# Patient Record
Sex: Male | Born: 2000 | Race: Black or African American | Hispanic: No
Health system: Southern US, Community
[De-identification: ages and names within clinical notes are randomized; demographics above are authoritative.]

---
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
## 2022-01-24 ENCOUNTER — Ambulatory Visit (INDEPENDENT_AMBULATORY_CARE_PROVIDER_SITE_OTHER): Payer: No Payment, Other | Admitting: Psychiatry

## 2022-01-24 ENCOUNTER — Encounter (HOSPITAL_COMMUNITY): Payer: Self-pay | Admitting: Psychiatry

## 2022-01-24 VITALS — BP 118/72 | HR 92 | Temp 98.3°F | Ht 66.0 in | Wt 140.0 lb

## 2022-01-24 DIAGNOSIS — F22 Delusional disorders: Secondary | ICD-10-CM

## 2022-01-24 DIAGNOSIS — F411 Generalized anxiety disorder: Secondary | ICD-10-CM

## 2022-01-24 DIAGNOSIS — F1220 Cannabis dependence, uncomplicated: Secondary | ICD-10-CM

## 2022-01-24 MED ORDER — RISPERIDONE 2 MG PO TABS: 2.0000 mg | ORAL_TABLET | Freq: Two times a day (BID) | ORAL | 2 refills | Status: DC

## 2022-01-24 NOTE — Progress Notes (Signed)
[place] Follow up visit  Patient Identification: Jeremy Cooper MRN:  [id number] Date of Evaluation:  01/24/2022 Referral Source: [facility] Chief Complaint:   No chief complaint on file.  Visit Diagnosis:    ICD-10-CM   1. Delusional disorder ([place])  F22     2. Generalized anxiety disorder  F41.1     3. Moderate tetrahydrocannabinol (THC) dependence (HCC)  F12.20        History of Present Illness: Patient is a 21 years old currently single African-American male initially referred by [facility], also seeing a therapist.  Patient was with his mom.  Has been diagnosed with delusions in the past in the emergency room and was given Zyprexa in December that helped him he has now finished up with that medication.  On eval remains guarded , endorses paranoia around people, says may want to move to [place] where he has more friends near by  Does not endorse hallucinations  Taking risperdal but not used zyprexa which was given for agitation prn Works at [employer] Also endorses using [facility] says use more of that , despite understanding or discussed risk of increase paranoia with it Says it helps him   Mom has endorsed before grandmother sister having schizophrenia and also patient's dad had some undiagnosed mental illness.     Aggravating factors; has not matained jobs for long Modifying factors; mom  Severity: still hallucinating Duration since young age  Hospital admission ER visit Suicide attempt denies  Past Psychiatric History: delusions, hallucinations  Previous Psychotropic Medications: Yes   Substance Abuse History in the last 12 months:  Yes.    Consequences of Substance Abuse: Effect of THC on or causing paranoia, amotivation and impaired judjement discussed  Past Medical History: History reviewed. No pertinent past medical history. History reviewed. No pertinent surgical history.  Family Psychiatric History: Grand Mother sister: schizophrenia Biological dad:? Undiagnosed  mental illness according to patient mom  Family History: History reviewed. No pertinent family history.  Social History:   Social History   Socioeconomic History   Marital status: Single    Spouse name: Not on file   Number of children: Not on file   Years of education: Not on file   Highest education level: Not on file  Occupational History   Not on file  Tobacco Use   Smoking status: Not on file   Smokeless tobacco: Not on file  Substance and Sexual Activity   Alcohol use: Not on file   Drug use: Not on file   Sexual activity: Not on file  Other Topics Concern   Not on file  Social History Narrative   ** Merged History Encounter **       Social Determinants of Health   Financial Resource Strain: Not on file  Food Insecurity: Not on file  Transportation Needs: Not on file  Physical Activity: Not on file  Stress: Not on file  Social Connections: Not on file    Additional Social History: grew up mostly with mom> did well till 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, to himself and not engaged in sports and activities  Patient says has been hearing voices since young age, mom didn't know  Allergies:   No Known Allergies   Metabolic Disorder Labs: Lab Results  Component Value Date   HGBA1C 4.9 07/28/2021   MPG 93.93 07/28/2021   No results found for: "PROLACTIN" No results found for: "CHOL", "TRIG", "HDL", "CHOLHDL", "VLDL", "LDLCALC" Lab Results  Component Value Date  TSH 4.121 07/28/2021    Therapeutic Level Labs: No results found for: "LITHIUM" No results found for: "CBMZ" No results found for: "VALPROATE"  Current Medications: Current Outpatient Medications  Medication Sig Dispense Refill   diphenhydramine-acetaminophen (TYLENOL PM) 25-500 MG TABS tablet Take 2 tablets by mouth at bedtime as needed (For sleep). (Patient not taking: Reported on 10/13/2021)     OLANZapine (ZYPREXA) 2.5 MG tablet Take 1 tablet (2.5 mg total) by mouth 2 (two) times  daily as needed (increase agitation). 30 tablet 0   risperiDONE (RISPERDAL) 2 MG tablet Take 1 tablet (2 mg total) by mouth 2 (two) times daily. 60 tablet 2   No current facility-administered medications for this visit.     Psychiatric Specialty Exam: Review of Systems  Cardiovascular:  Negative for chest pain.  Neurological:  Negative for tremors.  Psychiatric/Behavioral:  Negative for agitation and self-injury.     Blood pressure 118/72, pulse 92, temperature 98.3 F (36.8 C), height 5\' 6"  (1.676 m), weight 140 lb (63.5 kg).Body mass index is 22.6 kg/m.  General Appearance: Casual  Eye Contact: fair  Speech:  Slow  Volume:  Normal  Mood:  not irritable today  Affect:  Congruent  Thought Process:  Linear  Orientation:  Full (Time, Place, and Person)  Thought Content:  Hallucinations: Auditory and Paranoid Ideation  Suicidal Thoughts:  No  Homicidal Thoughts:  No  Memory:  Immediate;   Fair  Judgement:  Poor  Insight:  Shallow  Psychomotor Activity:  Normal  Concentration:  Concentration: Fair  Recall:  [place]: Fair  Akathisia:  No  Handed:    AIMS (if indicated):  no involuntary movements  Assets:  Housing Leisure Time Physical Health Social Support  ADL's:  Intact  Cognition: WNL  Sleep:  Fair   Screenings: PHQ2-9    [place] Office Visit from 07/13/2021 in [place] [facility] from 06/09/2021 in [place]  PHQ-2 Total Score 2 2  PHQ-9 Total Score 7 8      [place] Office Visit from 01/24/2022 in [place] Office Visit from 10/13/2021 in [place] Office Visit from 08/18/2021 in [place] No Risk Error: Q3, 4, or 5 should not be populated when Q2 is No Error: Q3, 4, or 5 should not be populated  when Q2 is No       Assessment and Plan: as follows  Prior documentation reviewed  Schizophrenia or psychosis unspecified: considering he has family history of schizophrenia, paranoia and hallucinations more then 5 -6 months  Does not endors AH but has paranoia, feels meds not need increase, still taking THC despite it can cause paranoia  Use olanzapine prn for agitation   Marijuana dependence moderate; ;still using, discussed its risk  GAD: gets anxious around people but does better when with mom or by himself Direct care time spent including face to face, chart review and documentation  20 min  Fu 56m.  Merian Capron, MD 10/17/20233:02 PM

## 2022-04-25 ENCOUNTER — Ambulatory Visit (HOSPITAL_COMMUNITY): Payer: No Payment, Other | Admitting: Psychiatry

## 2022-05-08 ENCOUNTER — Ambulatory Visit (HOSPITAL_COMMUNITY): Payer: No Payment, Other | Admitting: Psychiatry

## 2022-05-22 ENCOUNTER — Encounter (HOSPITAL_COMMUNITY): Payer: Self-pay | Admitting: Psychiatry

## 2022-05-22 ENCOUNTER — Encounter (HOSPITAL_COMMUNITY): Payer: Self-pay | Admitting: Emergency Medicine

## 2022-05-22 ENCOUNTER — Other Ambulatory Visit: Payer: Self-pay

## 2022-05-22 ENCOUNTER — Ambulatory Visit (HOSPITAL_COMMUNITY)
Admission: EM | Admit: 2022-05-22 | Discharge: 2022-05-22 | Disposition: A | Discharge status: Other Acute Inpatient Hospital | Payer: Self-pay | Attending: Family | Admitting: Family

## 2022-05-22 ENCOUNTER — Inpatient Hospital Stay (HOSPITAL_COMMUNITY)
Admission: AD | Admit: 2022-05-22 | Discharge: 2022-05-27 | DRG: 885 | Disposition: A | Discharge status: Home / Self Care | Payer: No Typology Code available for payment source | Source: Intra-hospital | Attending: Psychiatry | Admitting: Psychiatry

## 2022-05-22 DIAGNOSIS — F1220 Cannabis dependence, uncomplicated: Secondary | ICD-10-CM | POA: Diagnosis present

## 2022-05-22 DIAGNOSIS — Z20822 Contact with and (suspected) exposure to covid-19: Secondary | ICD-10-CM | POA: Diagnosis present

## 2022-05-22 DIAGNOSIS — Z79899 Other long term (current) drug therapy: Secondary | ICD-10-CM

## 2022-05-22 DIAGNOSIS — Z91199 Patient's noncompliance with other medical treatment and regimen due to unspecified reason: Secondary | ICD-10-CM | POA: Diagnosis not present

## 2022-05-22 DIAGNOSIS — F329 Major depressive disorder, single episode, unspecified: Secondary | ICD-10-CM | POA: Diagnosis present

## 2022-05-22 DIAGNOSIS — F203 Undifferentiated schizophrenia: Secondary | ICD-10-CM | POA: Diagnosis present

## 2022-05-22 DIAGNOSIS — F411 Generalized anxiety disorder: Secondary | ICD-10-CM | POA: Diagnosis present

## 2022-05-22 DIAGNOSIS — Z1152 Encounter for screening for COVID-19: Secondary | ICD-10-CM | POA: Insufficient documentation

## 2022-05-22 DIAGNOSIS — K5900 Constipation, unspecified: Secondary | ICD-10-CM | POA: Diagnosis present

## 2022-05-22 DIAGNOSIS — G4700 Insomnia, unspecified: Secondary | ICD-10-CM | POA: Diagnosis present

## 2022-05-22 DIAGNOSIS — Z91148 Patient's other noncompliance with medication regimen for other reason: Secondary | ICD-10-CM

## 2022-05-22 DIAGNOSIS — R45851 Suicidal ideations: Secondary | ICD-10-CM | POA: Insufficient documentation

## 2022-05-22 DIAGNOSIS — R440 Auditory hallucinations: Secondary | ICD-10-CM | POA: Insufficient documentation

## 2022-05-22 DIAGNOSIS — K30 Functional dyspepsia: Secondary | ICD-10-CM | POA: Diagnosis present

## 2022-05-22 LAB — COMPREHENSIVE METABOLIC PANEL
ALT: 13 U/L (ref 0–44)
AST: 25 U/L (ref 15–41)
Albumin: 4.3 g/dL (ref 3.5–5.0)
Alkaline Phosphatase: 64 U/L (ref 38–126)
Anion gap: 11 (ref 5–15)
BUN: 9 mg/dL (ref 6–20)
CO2: 26 mmol/L (ref 22–32)
Calcium: 9.3 mg/dL (ref 8.9–10.3)
Chloride: 102 mmol/L (ref 98–111)
Creatinine, Ser: 0.85 mg/dL (ref 0.61–1.24)
GFR, Estimated: >60 mL/min (ref >60)
Glucose, Bld: 95 mg/dL (ref 70–99)
Potassium: 3.5 mmol/L (ref 3.5–5.1)
Sodium: 139 mmol/L (ref 135–145)
Total Bilirubin: 0.4 mg/dL (ref 0.3–1.2)
Total Protein: 6.9 g/dL (ref 6.5–8.1)

## 2022-05-22 LAB — TSH: TSH: 1.631 u[IU]/mL (ref 0.350–4.500)

## 2022-05-22 LAB — POCT URINE DRUG SCREEN - MANUAL ENTRY (I-SCREEN)
POC Amphetamine UR: NOT DETECTED
POC Buprenorphine (BUP): NOT DETECTED
POC Cocaine UR: NOT DETECTED
POC Marijuana UR: POSITIVE — AB
POC Methadone UR: NOT DETECTED
POC Methamphetamine UR: NOT DETECTED
POC Morphine: NOT DETECTED
POC Oxazepam (BZO): NOT DETECTED
POC Oxycodone UR: NOT DETECTED
POC Secobarbital (BAR): NOT DETECTED

## 2022-05-22 LAB — LIPID PANEL
Cholesterol: 164 mg/dL (ref 0–200)
HDL: 37 mg/dL — ABNORMAL LOW (ref >40)
LDL Cholesterol: 114 mg/dL — ABNORMAL HIGH (ref 0–99)
Total CHOL/HDL Ratio: 4.4 RATIO
Triglycerides: 64 mg/dL (ref <150)
VLDL: 13 mg/dL (ref 0–40)

## 2022-05-22 LAB — CBC WITH DIFFERENTIAL/PLATELET
Abs Immature Granulocytes: 0.01 10*3/uL (ref 0.00–0.07)
Basophils Absolute: 0 10*3/uL (ref 0.0–0.1)
Basophils Relative: 0 %
Eosinophils Absolute: 0.1 10*3/uL (ref 0.0–0.5)
Eosinophils Relative: 2 %
HCT: 40.1 % (ref 39.0–52.0)
Hemoglobin: 14 g/dL (ref 13.0–17.0)
Immature Granulocytes: 0 %
Lymphocytes Relative: 57 %
Lymphs Abs: 3.1 10*3/uL (ref 0.7–4.0)
MCH: 29.7 pg (ref 26.0–34.0)
MCHC: 34.9 g/dL (ref 30.0–36.0)
MCV: 85 fL (ref 80.0–100.0)
Monocytes Absolute: 0.4 10*3/uL (ref 0.1–1.0)
Monocytes Relative: 7 %
Neutro Abs: 1.8 10*3/uL (ref 1.7–7.7)
Neutrophils Relative %: 34 %
Platelets: 268 10*3/uL (ref 150–400)
RBC: 4.72 MIL/uL (ref 4.22–5.81)
RDW: 12.6 % (ref 11.5–15.5)
WBC: 5.5 10*3/uL (ref 4.0–10.5)
nRBC: 0 % (ref 0.0–0.2)

## 2022-05-22 LAB — RESP PANEL BY RT-PCR (RSV, FLU A&B, COVID)  RVPGX2
Influenza A by PCR: NEGATIVE
Influenza B by PCR: NEGATIVE
Resp Syncytial Virus by PCR: NEGATIVE
SARS Coronavirus 2 by RT PCR: NEGATIVE

## 2022-05-22 LAB — POC SARS CORONAVIRUS 2 AG: SARSCOV2ONAVIRUS 2 AG: NEGATIVE

## 2022-05-22 LAB — HEMOGLOBIN A1C
Hgb A1c MFr Bld: 4.9 % (ref 4.8–5.6)
Mean Plasma Glucose: 93.93 mg/dL

## 2022-05-22 MED ORDER — OLANZAPINE 10 MG PO TBDP: 10.0000 mg | ORAL_TABLET | Freq: Three times a day (TID) | ORAL | Status: DC | PRN

## 2022-05-22 MED ORDER — RISPERIDONE 2 MG PO TABS: 2.0000 mg | ORAL_TABLET | Freq: Every day | ORAL | Status: DC

## 2022-05-22 MED ORDER — HYDROXYZINE HCL 25 MG PO TABS: 25.0000 mg | ORAL_TABLET | Freq: Three times a day (TID) | ORAL | Status: DC | PRN

## 2022-05-22 MED ORDER — ACETAMINOPHEN 325 MG PO TABS: 650.0000 mg | ORAL_TABLET | Freq: Four times a day (QID) | ORAL | Status: DC | PRN

## 2022-05-22 MED ORDER — MAGNESIUM HYDROXIDE 400 MG/5ML PO SUSP: 30.0000 mL | Freq: Every day | ORAL | Status: DC | PRN

## 2022-05-22 MED ORDER — ZIPRASIDONE MESYLATE 20 MG IM SOLR: 20.0000 mg | INTRAMUSCULAR | Status: DC | PRN

## 2022-05-22 MED ORDER — ALUM & MAG HYDROXIDE-SIMETH 200-200-20 MG/5ML PO SUSP: 30.0000 mL | ORAL | Status: DC | PRN

## 2022-05-22 MED ORDER — LORAZEPAM 1 MG PO TABS: 1.0000 mg | ORAL_TABLET | ORAL | Status: DC | PRN

## 2022-05-22 MED ORDER — TRAZODONE HCL 50 MG PO TABS: 50.0000 mg | ORAL_TABLET | Freq: Every evening | ORAL | Status: DC | PRN

## 2022-05-22 MED ORDER — RISPERIDONE 2 MG PO TABS
2.0000 mg | ORAL_TABLET | Freq: Two times a day (BID) | ORAL | Status: DC
Start: 2022-05-22 — End: 2022-05-22

## 2022-05-22 NOTE — ED Notes (Signed)
Report called to Thayer Headings, RN,  Pending [employer] and Serve wiith IVC papers.

## 2022-05-22 NOTE — Progress Notes (Signed)
   05/22/22 2300  Psych Admission Type (Psych Patients Only)  Admission Status Involuntary  Psychosocial Assessment  Patient Complaints Suspiciousness;Anxiety;Other (Comment);Crying spells (hearing voices)  Eye Contact Brief  Facial Expression Flat  Affect Flat  Speech Soft  Interaction Minimal  Motor Activity Fidgety  Appearance/Hygiene Unremarkable  Behavior Characteristics Cooperative;Fidgety;Guarded  Mood Depressed;Anxious  Aggressive Behavior  Effect No apparent injury  Thought Process  Coherency WDL  Content Blaming others  Delusions None reported or observed  Perception Hallucinations  Hallucination Auditory  Judgment Limited  Confusion None  Danger to Self  Current suicidal ideation? Denies  Danger to Others  Danger to Others None reported or observed

## 2022-05-22 NOTE — BH Assessment (Signed)
Comprehensive Clinical Assessment (CCA) Note  05/22/2022 Jeremy Cooper [id number]  DISPOSITION: Pending AP assessment  The patient demonstrates the following risk factors for suicide: Chronic risk factors for suicide include: psychiatric disorder of MDD and substance use disorder. Acute risk factors for suicide include: social withdrawal/isolation and cannabis use . Protective factors for this patient include: hope for the future. Considering these factors, the overall suicide risk at this point appears to be moderate. Patient is appropriate for outpatient follow up.   Jeremy Cooper is a 22 yo male who presented voluntarily and accompanied by his mother. Mother was nor presente for the assessment. Pt stated he has hearing voices and feeling hopeless at times that that is what mode his mother tell him to come in for an assessment. Pt denied current SI but stated that he had some SI "last week." Pt denied any plan of action. Pt stated that he did try once to kill himself by cutting his wrist. Pt stated that attempt was several years ago. P dened he has ever been hospitalized psychiatrically. Pt reported that he is and has been having thought of hurting other people in general. Pt stated that he has a plan but would not disclose any details about that plan. Pt denied targeting anyone specific to be hurt or killed. Pt denied NSSH. Pt stated that he has been hearing voices for a few years. Pt stated that at times the voices tell him to do things but would not be specific as to what they were.are telling him to do. Pt stated that he feels people watching him all the time everywhere he goes. Pt reported daily use of cannabis since the age of 46. Pt senied any other substance use. Pt denied any past psychiatric hospitalizations.   Pt reports he currently lives with his mother and that his mother raised him. Pt stated he graduated high school and now works at [employer]. Pt reported smoking cannabis daily and  denied all other substance use.   Pt was calm, cooperative, alert and cooperative. Pt seemed irritable at times.Pt's mood seemed depressed and his flat affect was congruent. Pt's speech and movement were wihtin normal limits. Pt's judgment and insight seemed impaired.   Chief Complaint:  Chief Complaint  Patient presents with   Depression   Visit Diagnosis:  MDD, Recurrent, Severe Cannabis Use d/o    CCA Screening, Triage and Referral (STR)  Patient Reported Information How did you hear about [place]? Family/Friend (mother)  What Is the Reason for Your Visit/Call Today? Jeremy Cooper is a 22 yo male who presented voluntarily and accompanied by his mother. Mother was nor presente for the assessment. Pt stated he has hearing voices and feeling hopeless at times that that is what mode his mother tell him to come in for an assessment. Pt denied current SI but stated that he had some SI "last week." Pt denied any plan of action. Pt stated that he did try once to kill himself by cutting his wrist. Pt stated that attempt was several years ago. P dened he has ever been hospitalized psychiatrically. Pt reported that he is and has been having thought of hurting others people in general. Pt stated that he has a plan but would not disclose any details about that plan. Pt denied targeting anyone specific to be hurt or killed. Pt denied NSSH. Pt stated that he has been hearing voices for a few years. Pt stated that at times the voices tell him to do things but would  not be specific as to what they were.are telling him to do. Pt stated that he feels people watching him all the time everywhere he goes. Pt reported daily use of cannabis since the age of 22. Pt senied any other substance use. Pt denied any past psychiatric hospitalizations.  How Long Has This Been Causing You Problems? > than 6 months  What Do You Feel Would Help You the Most Today? Treatment for Depression or other mood problem; Alcohol or Drug  Use Treatment   Have You Recently Had Any Thoughts About Hurting Yourself? Yes  Are You Planning to Commit Suicide/Harm Yourself At This time? No   Flowsheet Row ED from 05/22/2022 in [facility] Office Visit from 01/24/2022 in [place] at [place] Visit from 10/13/2021 in [place] at [place] Moderate Risk No Risk Error: Q3, 4, or 5 should not be populated when Q2 is No       Have you Recently Had Thoughts About Jeremy Cooper? Yes  Are You Planning to Harm Someone at This Time? Yes  Explanation: pt would give no details   Have You Used Any Alcohol or Drugs in the Past 24 Hours? Yes  What Did You Use and How Much? unknown amount   Do You Currently Have a Therapist/Psychiatrist? Yes  Name of Therapist/Psychiatrist: Name of Therapist/Psychiatrist: Dr. De Nurse   Have You Been Recently Discharged From Any Office Practice or Programs? No  Explanation of Discharge From Practice/Program: na     CCA Screening Triage Referral Assessment Type of Contact: Face-to-Face  Telemedicine Service Delivery:   Is this Initial or Reassessment?   Date Telepsych consult ordered in [facility]:    Time Telepsych consult ordered in [facility]:    Location of Assessment: [facility] Assessment Services  Provider Location: [facility] Assessment Services   Collateral Involvement: pt mother accompanied pt but pt assessed alone   Does Patient Have a Stage manager Guardian? No  Legal Guardian Contact Information: na  Copy of Legal Guardianship Form: No - copy requested  Legal Guardian Notified of Arrival: -- (na)  Legal Guardian Notified of Pending Discharge: -- (na)  If Minor and Not Living with Parent(s), Who has Custody? na  Is CPS involved or ever been involved? Never (none reported)  Is APS involved or ever been involved? Never (none  reported)   Patient Determined To Be At Risk for Harm To Self or Others Based on Review of Patient Reported Information or Presenting Complaint? Yes, for Harm to Others  Method: Plan with intent and identified person  Availability of Means: Has close by (Pt did not give details of his plan to hurt others.)  Intent: Intends to cause physical harm but not necessarily death  Notification Required: No need or identified person (Pt stated no one in particular was targeted.)  Additional Information for Danger to Others Potential: Active psychosis (AV and some paranoia)  Additional Comments for Danger to Others Potential: na  Are There Guns or Other Weapons in Your Home? No (denied)  Types of Guns/Weapons: na  Are These Weapons Safely Secured?                            -- (na)  Who Could Verify You Are Able To Have These Secured: na  Do You Have any Outstanding Charges, Pending Court Dates, Parole/Probation? denied- none reported  Contacted To Inform of Risk of Harm To Self or Others: -- (No specific person or people were idenitified. Pt refused.)    Does Patient Present under Involuntary Commitment? No    [place] of Residence: [place]   Patient Currently Receiving the Following Services: Medication Management   Determination of Need: Urgent (48 hours)   Options For Referral: [facility]     CCA Biopsychosocial Patient Reported Schizophrenia/Schizoaffective Diagnosis in Past: No   Strengths: likes to exercise, likes being in nature,   Mental Health Symptoms Depression:   Change in energy/activity; Difficulty Concentrating; Fatigue; Hopelessness; Increase/decrease in appetite; Irritability; Sleep (too much or little); Tearfulness; Worthlessness   Duration of Depressive symptoms:    Mania:   None   Anxiety:    Difficulty concentrating; Fatigue; Irritability; Restlessness; Sleep; Tension; Worrying   Psychosis:   Hallucinations   Duration of Psychotic  symptoms:  Duration of Psychotic Symptoms: Greater than six months   Trauma:   None   Obsessions:   None   Compulsions:   None   Inattention:   N/A   Hyperactivity/Impulsivity:   N/A   Oppositional/Defiant Behaviors:   N/A   Emotional Irregularity:   None   Other Mood/Personality Symptoms:   None    Mental Status Exam Appearance and self-care  Stature:   Average   Weight:   Average weight   Clothing:   Casual   Grooming:   Normal   Cosmetic use:   None   Posture/gait:   Normal   Motor activity:   Not Remarkable   Sensorium  Attention:   Normal   Concentration:   Normal   Orientation:   X5   Recall/memory:   Normal   Affect and Mood  Affect:   Anxious; Depressed; Flat   Mood:   Anxious; Depressed   Relating  Eye contact:   Fleeting   Facial expression:   Depressed   Attitude toward examiner:   Cooperative; Guarded; Irritable   Thought and Language  Speech flow:  Clear and Coherent   Thought content:   Appropriate to Mood and Circumstances   Preoccupation:   None   Hallucinations:   Auditory; Command (Comment)   Organization:   Insurance account manager of Knowledge:   Average   Intelligence:   Average   Abstraction:   Normal   Judgement:   Impaired   Reality Testing:   Distorted   Insight:   Gaps; Lacking   Decision Making:   Vacilates   Social Functioning  Social Maturity:   -- (Unable to assess due to psychitic and mood-related sx.)   Social Judgement:   Normal   Stress  Stressors:   Museum/gallery curator; Work; Other (Comment) (Feels like he let his family down)   Coping Ability:   Overwhelmed; Exhausted   Skill Deficits:   None   Supports:   Family; Friends/Service system     Religion: Religion/Spirituality Are You A Religious Person?: Yes How Might This Affect Treatment?: Support in treatement  Leisure/Recreation: Leisure / Recreation Do You Have Hobbies?:  Yes Leisure and Hobbies: exercise  Exercise/Diet: Exercise/Diet Do You Exercise?: Yes What Type of Exercise Do You Do?: Run/Walk How Many Times a Week Do You Exercise?: 1-3 times a week Have You Gained or Lost A Significant Amount of Weight in the Past Six Months?: No Do You Follow a Special Diet?: No Do You Have Any Trouble Sleeping?: No   CCA Employment/Education Employment/Work Situation: Employment / Work  Situation Employment Situation: Employed (at [employer]) Work Stressors: none reported Patient's Job has Been Impacted by Current Illness: No Has Patient ever Been in the [employer]?: No  Education: Education Is Patient Currently Attending School?: No Last Grade Completed: 15 Did [place]?: No Did You Have An Individualized Education Program (IIEP): No Did You Have Any Difficulty At School?: No Patient's Education Has Been Impacted by Current Illness: No   CCA Family/Childhood History Family and Relationship History: Family history Marital status: Single Does patient have children?: No  Childhood History:  Childhood History By whom was/is the patient raised?: Mother Did patient suffer any verbal/emotional/physical/sexual abuse as a child?: No Has patient ever been sexually abused/assaulted/raped as an adolescent or adult?: No Witnessed domestic violence?: Yes Has patient been affected by domestic violence as an adult?: No       CCA Substance Use Alcohol/Drug Use: Alcohol / Drug Use Pain Medications: Pt reports he has history of using oxycodone Prescriptions: Denies abuse Over the Counter: Denies abuse History of alcohol / drug use?: Yes Longest period of sobriety (when/how long): unknown Negative Consequences of Use:  (Pt denies) Withdrawal Symptoms: None (Pt denies) Substance #1 Name of Substance 1: cannabis 1 - Age of First Use: 14 1 - Amount (size/oz): varies 1 - Frequency: daily use 1 - Duration: ongoing 1 - Last Use / Amount: today 1 -  Method of Aquiring: unknown 1- Route of Use: smoke                       ASAM's:  Six Dimensions of Multidimensional Assessment  Dimension 1:  Acute Intoxication and/or Withdrawal Potential:   Dimension 1:  Description of individual's past and current experiences of substance use and withdrawal: Used to smoke daily but has reduced to weekly  Dimension 2:  Biomedical Conditions and Complications:   Dimension 2:  Description of patient's biomedical conditions and  complications: None  Dimension 3:  Emotional, Behavioral, or Cognitive Conditions and Complications:  Dimension 3:  Description of emotional, behavioral, or cognitive conditions and complications: Patient is experiencing auditory hallucinations and anxiety symptoms.  Dimension 4:  Readiness to Change:  Dimension 4:  Description of Readiness to Change criteria: Has reduced and thinking about stopping  Dimension 5:  Relapse, Continued use, or Continued Problem Potential:  Dimension 5:  Relapse, continued use, or continued problem potential critiera description: Has reduced and is continuing to slow down  Dimension 6:  Recovery/Living Environment:  Dimension 6:  Recovery/Iiving environment criteria description: Feels like mother judges him at home  ASAM Severity Score: ASAM's Severity Rating Score: 8  ASAM Recommended Level of Treatment: ASAM Recommended Level of Treatment: Level I Outpatient Treatment   Substance use Disorder (SUD) Substance Use Disorder (SUD)  Checklist Symptoms of Substance Use: Continued use despite having a persistent/recurrent physical/psychological problem caused/exacerbated by use  Recommendations for Services/Supports/Treatments: Recommendations for Services/Supports/Treatments Recommendations For Services/Supports/Treatments: Individual Therapy, Medication Management, Other (Comment) (OBS)  Discharge Disposition:    DSM5 Diagnoses: There are no problems to display for this  patient.    Referrals to Alternative Service(s): Referred to Alternative Service(s):   Place:   Date:   Time:    Referred to Alternative Service(s):   Place:   Date:   Time:    Referred to Alternative Service(s):   Place:   Date:   Time:    Referred to Alternative Service(s):   Place:   Date:   Time:     Drelyn Pistilli T,  Counselor  Stanton Kidney T. Mare Ferrari, [place], [facility], [facility] Triage Specialist [facility]

## 2022-05-22 NOTE — ED Notes (Signed)
Patient arrived to unit and was oriented to unit. Denies SI/HI but endorses some auditory hallucinations. He does not elaborate on what the voices are saying but he is here voluntarily for help. Contracts for safety while on unit.

## 2022-05-22 NOTE — ED Notes (Signed)
[facility] Transport to [facility] to [facility], Pt is IVC.  RM [id number].  Dispatch Contacted.

## 2022-05-22 NOTE — Progress Notes (Signed)
   05/22/22 1220  Jeremy Cooper (Walk-ins at [facility] only)  How Did You Hear About [place]? Family/Friend (mother)  What Is the Reason for Your Visit/Call Today? Jeremy Cooper is a 22 yo male who presented voluntarily and accompanied by his mother. Mother was nor presente for the assessment. Pt stated he has hearing voices and feeling hopeless at times that that is what mode his mother tell him to come in for an assessment. Pt denied current SI but stated that he had some SI "last week." Pt denied any plan of action. Pt stated that he did try once to kill himself by cutting his wrist. Pt stated that attempt was several years ago. P dened he has ever been hospitalized psychiatrically. Pt reported that he is and has been having thought of hurting others people in general. Pt stated that he has a plan but would not disclose any details about that plan. Pt denied targeting anyone specific to be hurt or killed. Pt denied NSSH. Pt stated that he has been hearing voices for a few years. Pt stated that at times the voices tell him to do things but would not be specific as to what they were.are telling him to do. Pt stated that he feels people watching him all the time everywhere he goes. Pt reported daily use of cannabis since the age of 43. Pt senied any other substance use. Pt denied any past psychiatric hospitalizations.  How Long Has This Been Causing You Problems? > than 6 months  Have You Recently Had Any Thoughts About Hurting Yourself? Yes  How long ago did you have thoughts about hurting yourself? "last week"  Are You Planning to [place] At This time? No  Have you Recently Had Thoughts About [place]? Yes  How long ago did you have thoughts of harming others? current thoughts  Are You Planning To Harm Someone At This Time? Yes  Explanation: pt would give no details  Are you currently experiencing any auditory, visual or other hallucinations? Yes  Please explain the  hallucinations you are currently experiencing: hearing voices that at times give commands to hurt himself or others  Have You Used Any Alcohol or Drugs in the Past 24 Hours? Yes  How long ago did you use Drugs or Alcohol? cannabis today  What Did You Use and How Much? unknown amount  Do you have any current medical co-morbidities that require immediate attention? No  Clinician description of patient physical appearance/behavior: Pt was calm, cooperative, alert and cooperative. Pt seemed irritable at times.Pt's mood seemed depressed and his flat affect was congruent. Pt's speech and movement were wihtin normal limits. Pt's judgment and insight seemed impaired.  What Do You Feel Would Help You the Most Today? Treatment for Depression or other mood problem;Alcohol or Drug Use Treatment  If access to [facility] Urgent Care was not available, would you have sought care in the Emergency Department? No  Determination of Need Urgent (48 hours)  Options For Referral Facility-Based Crisis   Jeremy Cooper, [place], [facility], [facility] Triage Specialist [facility]

## 2022-05-22 NOTE — Progress Notes (Signed)
ADMISSION NOTE:    This patient is a 22-year male that presented to [facility] with his mother reporting of AH/SI/HI. At this time, the patient presents to [facility] reporting AH/HI. Patient reports that he has been hearing voices for "almost a year now." Reports that the voices, "tell him to harm himself sometimes or sometimes other people." He also states, "sometimes I want to kill myself and sometimes I don't." He denies SI/VH at this time. He reports that he stopped taking his medications x1 month. At this time patient is tearful stating that, "I just want to go home." Patient contracts for safety at this time. Patient oriented to unit. Patient educated on [facility] rules and policies, and patient verbalized understanding. Patient made aware about q15 min checks. Patient denies having any questions at this time.

## 2022-05-22 NOTE — Progress Notes (Addendum)
Pt was accepted to [facility] 05/22/22 PENDING IVC paperwork faxed to [phone]  Pt meets inpatient criteria per Ricky Ala, NP  Attending Physician will be Dr. Caswell Corwin  Report can be called to: Adult unit: [phone]  Pt can arrive after PENDING ITEMS; [place] will coordinate and provide bed assignment number.  Care Team notified: [facility] Dana, RN, Evette Georges, NP, Luanna Salk, RN,  Miguel Rota, RN, Doreatha Lew, RN  Nadara Mode, LCSWA 05/22/2022 @ 7:03 PM

## 2022-05-22 NOTE — Progress Notes (Signed)
   Initial Treatment Plan 05/22/2022 11:19 PM Jeremy Cooper  MRN: [id number]       PATIENT STRESSORS:  Family conflict  Lack of sleep Noncompliance with medication       PATIENT STRENGTHS: Communication skills      PATIENT IDENTIFIED PROBLEMS: "I am hearing voices that tell me to harm myself and other people"  "I havent taken my medications in about a month"  Depression  Anxiety   "sometimes I want to kill my and sometimes I dont."                   DISCHARGE CRITERIA:  Ability to meet basic life and health needs Improved stabilization in mood, thinking, and/or behavior Verbal commitment to aftercare and medication compliance   PRELIMINARY DISCHARGE PLAN: Outpatient therapy Return to previous living arrangement   PATIENT/FAMILY INVOLVEMENT: This treatment plan has been presented to and reviewed with the patient, Jeremy Cooper , The patient has been given the opportunity to ask questions and make suggestions.   Jeremy Cooper Robert, RN 05/22/2022 11:25 PM

## 2022-05-22 NOTE — ED Provider Notes (Cosign Needed Addendum)
**Note Jeremy-Identified via Obfuscation** Facility Based Crisis Admission H&P  Date: 05/22/22 Patient Name: Jeremy Cooper MRN: [id number] Chief Complaint: Suicidal and homicidal ideation  Diagnoses:  Final diagnoses:  Suicidal ideation  Auditory hallucination   HPI: Jeremy Cooper 22 year old male who presents to [facility] urgent care accompanied by his mother.  She reports an episode on 05/21/2022  (last night) where she reported that the patient was actively responding to internal stimuli. Patient reported command auditory hallucination. Stated he hasn't been sleeping due to working overnight. Stated that patient has not been taking medications as directed.  During this assessment Jeremy Cooper reported  having suicidal and homicidal ideations.  Would not disclose plan or intent patient presents very irritable and guarded throughout this assessment.  Jeremy Cooper has a charted history with delusional thought disorder, homicidal ideations, generalized anxiety disorder, psychosis unspecified.  He reports he is prescribed Risperdal which she has not been taking as indicated states " the medications not helping."  Patient was seen and evaluated at the local emergent care 6 months prior with similar concerns.  Medications was adjusted at that time to Zyprexa.  Reports he is currently followed by psychiatrist Jeremy Cooper.  For medication management.  And was seeing Jeremy Cooper  for therapy services.   During evaluation Jeremy Cooper is sitting; he is alert/oriented x 3; calm/cooperative; and mood congruent with affect.  Presents irritable tearful and pressured. Stated " just want to go home."  patient is speaking in a clear tone at moderate volume, and normal pace; with minimal  eye contact. Patient has a hooddie covering  his head.  His thought process is coherent and relevant. Reported active suicidal and homicidal ideation. Will initiate Involuntary commitment.    PHQ 2-9:  [employer] Visit from 07/13/2021 in [place] at [place] from 06/09/2021 in [place] [facility] [place] that you would be better off dead, or of hurting yourself in some way Not at all Not at all  PHQ-9 Total Score 7 [place] [facility] from 05/22/2022 in [facility] Visit from 01/24/2022 in [place] [facility] [place] Visit from 10/13/2021 in [place] [facility] [place] Moderate Risk No Risk Error: Q3, 4, or 5 should not be populated when Q2 is No        Total Time spent with patient: 15 minutes  Musculoskeletal  Strength & Muscle Tone: within normal limits Gait & Station: normal Patient leans: N/A  Psychiatric Specialty Exam  Presentation General Appearance:  Appropriate for Environment  Eye Contact: Minimal  Speech: Clear and Coherent  Speech Volume: Normal  Handedness: Right   Mood and Affect  Mood: Dysphoric  Affect: Labile   Thought Process  Thought Processes: Coherent  Descriptions of Associations:Intact  Orientation:Full (Time, Place and Person)  Thought Content:Logical  Diagnosis of Schizophrenia or Schizoaffective disorder in past: No  Duration of Psychotic Symptoms: N/A  Hallucinations:Hallucinations: Auditory  Ideas of Reference:Paranoia; Delusions  Suicidal Thoughts:Suicidal Thoughts: Yes, Active  Homicidal Thoughts:Homicidal Thoughts: Yes, Active   Sensorium  Memory: Immediate Fair; Recent Fair; Remote Fair  Judgment: Fair  Insight: Fair   Community education officer  Concentration: Fair  Attention Span: Good  Recall: [place] of Knowledge: Good  Language: Good   Psychomotor Activity  Psychomotor Activity:Psychomotor Activity: Normal   Assets  Assets: Desire for Improvement; Intimacy   Sleep  Sleep:Sleep:  Fair   Nutritional Assessment (For OBS and FBC admissions only) Has the patient had a weight loss or gain of 10 pounds or more in the last 3 months?: No Has the patient had a decrease in food intake/or appetite?: No Does the patient have dental problems?: No Does the patient have eating habits or behaviors that may be indicators of an eating disorder including binging or inducing vomiting?: No Has the patient recently lost weight without trying?: 0 Has the patient been eating poorly because of a decreased appetite?: 0 Malnutrition Screening Tool Score: 0    Physical Exam Vitals and nursing note reviewed.  Cardiovascular:     Rate and Rhythm: Normal rate and regular rhythm.  Pulmonary:     Effort: Pulmonary effort is normal.  Neurological:     Mental Status: He is alert and oriented to person, place, and time.  Psychiatric:        Mood and Affect: Mood normal.        Behavior: Behavior normal.    Review of Systems  Cardiovascular: Negative.   Psychiatric/Behavioral:  Positive for hallucinations, substance abuse and suicidal ideas. The patient is nervous/anxious.   All other systems reviewed and are negative.   Blood pressure 109/67, pulse 69, temperature 98 F (36.7 C), temperature source Oral, resp. rate 19, SpO2 100 %. There is no height or weight on file to calculate BMI.  Past Psychiatric History: Charted history with homicidal ideations, generalized anxiety disorder, psychosis and delusional thought disorder.  Has been treated with respite all and Zyprexa in the past  Is the patient at risk to self? Yes  Has the patient been a risk to self in the past 6 months? Yes .    Has the patient been a risk to self within the distant past? No   Is the patient a risk to others? No   Has the patient been a risk to others in the past 6 months? No   Has the patient been a risk to others within the distant past? 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)  Last Labs:  Admission on 05/22/2022  Component Date Value Ref Range Status   POC Amphetamine UR 05/22/2022 None Detected  NONE DETECTED (Cut Off Level 1000 ng/mL) Final   POC Secobarbital (BAR) 05/22/2022 None Detected  NONE DETECTED (Cut Off Level 300 ng/mL) Final   POC Buprenorphine (BUP) 05/22/2022 None Detected  NONE DETECTED (Cut Off Level 10 ng/mL) Final   POC Oxazepam (BZO) 05/22/2022 None Detected  NONE DETECTED (Cut Off Level 300 ng/mL) Final   POC Cocaine UR 05/22/2022 None Detected  NONE DETECTED (Cut Off Level 300 ng/mL) Final   POC Methamphetamine UR 05/22/2022 None Detected  NONE DETECTED (Cut Off Level 1000 ng/mL) Final   POC Morphine 05/22/2022 None Detected  NONE DETECTED (Cut Off Level 300 ng/mL) Final   POC Methadone UR 05/22/2022 None Detected  NONE DETECTED (Cut Off Level 300 ng/mL) Final   POC Oxycodone UR 05/22/2022 None Detected  NONE DETECTED (Cut Off Level 100 ng/mL) Final   POC Marijuana UR 05/22/2022 Positive (A)  NONE DETECTED (Cut Off Level 50 ng/mL) Final   SARSCOV2ONAVIRUS 2 AG 05/22/2022 NEGATIVE  NEGATIVE Final   Comment: (NOTE) SARS-CoV-2 antigen NOT DETECTED.  Negative results are presumptive.  Negative results do not preclude SARS-CoV-2 infection and should not be used as the sole basis for treatment or other patient management decisions, including infection  control decisions, particularly in the presence of clinical signs and  symptoms consistent with COVID-19, or in those who have been in contact with the virus.  Negative results must be combined with clinical observations, patient history, and epidemiological information. The expected result is Negative.  Fact Sheet for Patients:  HandmadeRecipes.com.cy  Fact Sheet for Healthcare Providers: FuneralLife.at  This test is not yet approved or cleared by the [place] FDA and  has been authorized for detection and/or diagnosis of SARS-CoV-2 by FDA under an Emergency Use Authorization (EUA).  This EUA will remain in effect (meaning this test can be used) for the duration of  the COV                          ID-19 declaration under Section 564(b)(1) of the Act, 21 U.S.C. section 360bbb-3(b)(1), unless the authorization is terminated or revoked sooner.      Allergies: Patient has no known allergies.  Medications:  Facility Ordered Medications  Medication   acetaminophen (TYLENOL) tablet 650 mg   alum & mag hydroxide-simeth (MAALOX/MYLANTA) 200-200-20 MG/5ML suspension 30 mL   magnesium hydroxide (MILK OF MAGNESIA) suspension 30 mL   hydrOXYzine (ATARAX) tablet 25 mg   traZODone (DESYREL) tablet 50 mg   PTA Medications  Medication Sig   risperiDONE (RISPERDAL) 2 MG tablet Take 1 tablet (2 mg total) by mouth 2 (two) times daily.     Medical Decision Making  Inpatient admission - Initiated IVC- pending  -     Recommendations  Based on my evaluation the patient does not appear to have an emergency medical condition.  Derrill Center, NP 05/22/22  2:57 PM

## 2022-05-23 DIAGNOSIS — F203 Undifferentiated schizophrenia: Principal | ICD-10-CM | POA: Diagnosis present

## 2022-05-23 DIAGNOSIS — G4700 Insomnia, unspecified: Secondary | ICD-10-CM | POA: Diagnosis present

## 2022-05-23 DIAGNOSIS — F411 Generalized anxiety disorder: Secondary | ICD-10-CM | POA: Diagnosis present

## 2022-05-23 DIAGNOSIS — F329 Major depressive disorder, single episode, unspecified: Secondary | ICD-10-CM | POA: Diagnosis present

## 2022-05-23 DIAGNOSIS — F1220 Cannabis dependence, uncomplicated: Secondary | ICD-10-CM | POA: Diagnosis present

## 2022-05-23 MED ORDER — MAGNESIUM HYDROXIDE 400 MG/5ML PO SUSP: 30.0000 mL | Freq: Every day | ORAL | Status: DC | PRN

## 2022-05-23 MED ORDER — HYDROXYZINE HCL 25 MG PO TABS
25.0000 mg | ORAL_TABLET | Freq: Four times a day (QID) | ORAL | Status: DC | PRN
  Filled 2022-05-23: qty 1

## 2022-05-23 MED ORDER — TRAZODONE HCL 50 MG PO TABS
50.0000 mg | ORAL_TABLET | Freq: Every evening | ORAL | Status: DC | PRN
  Filled 2022-05-23: qty 1

## 2022-05-23 MED ORDER — ACETAMINOPHEN 325 MG PO TABS
650.0000 mg | ORAL_TABLET | Freq: Four times a day (QID) | ORAL | Status: DC | PRN
  Filled 2022-05-23: qty 2

## 2022-05-23 MED ORDER — RISPERIDONE 2 MG PO TABS
2.0000 mg | ORAL_TABLET | Freq: Two times a day (BID) | ORAL | Status: DC
  Administered 2022-05-23 – 2022-05-24 (×2): 2 mg via ORAL
  Filled 2022-05-23 (×8): qty 1

## 2022-05-23 MED ORDER — ALUM & MAG HYDROXIDE-SIMETH 200-200-20 MG/5ML PO SUSP: 30.0000 mL | ORAL | Status: DC | PRN

## 2022-05-23 NOTE — [facility] Group Notes (Signed)
Pt did not attend wrap up group this evening. Pt was in their room resting.

## 2022-05-23 NOTE — [facility] Group Notes (Signed)
Adult Psychoeducational Group Note  Date:  05/23/2022 Time:  3:50 PM  Group Topic/Focus:  Goals Group:   The focus of this group is to help patients establish daily goals to achieve during treatment and discuss how the patient can incorporate goal setting into their daily lives to aide in recovery.  Participation Level:  Minimal  Participation Quality:  Appropriate  Affect:  Appropriate  Cognitive:  Appropriate  Insight: Appropriate  Engagement in Group:  Engaged  Modes of Intervention:  Discussion  Additional Comments:  Group discussed importance of values and boundaries. Patient had limited engagement throughout the entirety of Group.  Victorino December 05/23/2022, 3:50 PM

## 2022-05-23 NOTE — Group Note (Signed)
Recreation Therapy Group Note   Group Topic:Animal Assisted Therapy   Group Date: 05/23/2022 Start Time: 1430 End Time: 1500 Facilitators: Kimm Sider-McCall, LRT,CTRS Location: [address] Dayroom   Animal-Assisted Activity (AAA) Program Checklist/Progress Notes Patient Eligibility Criteria Checklist & Daily Group note for Rec Tx Intervention  AAA/T Program Assumption of Risk Form signed by Patient/ or Parent Legal Guardian Yes  Patient understands his/her participation is voluntary No   Affect/Mood: N/A   Participation Level: Did not attend    Clinical Observations/Individualized Feedback:     Plan: Continue to engage patient in RT group sessions 2-3x/week.   Jeremy Cooper, LRT,CTRS 05/23/2022 3:41 PM

## 2022-05-23 NOTE — [facility] Suicide Risk Assessment (Signed)
Suicide Risk Assessment  Discharge Assessment    [facility]   Principal Problem: Undifferentiated schizophrenia [facility]) Discharge Diagnoses: Principal Problem:   Undifferentiated schizophrenia ([place]) Active Problems:   Insomnia   Delta-9-tetrahydrocannabinol (THC) dependence ([place])   Anxiety state  CC:"I have been hearing voices for a couple of years".   Current Symptoms requiring continuous hospitalization: Insomnia, depressed mood, worsening psychosis-auditory hallucinations-in need of continuous hospitalization for treatment and stabilization. Total Time spent with patient: 1.5 hours  Musculoskeletal: Strength & Muscle Tone: within normal limits Gait & Station: normal Patient leans: N/A  Psychiatric Specialty Exam  Presentation  General Appearance:  Appropriate for Environment; Fairly Groomed  Eye Contact: Poor  Speech: Clear and Coherent  Speech Volume: Decreased  Handedness: Right   Mood and Affect  Mood: Depressed; Dysphoric  Duration of Depression Symptoms: Greater than two weeks  Affect: Congruent   Thought Process  Thought Processes: Coherent  Descriptions of Associations:Intact  Orientation:Full (Time, Place and Person)  Thought Content:Logical  History of Schizophrenia/Schizoaffective disorder:No  Duration of Psychotic Symptoms:Greater than six months  Hallucinations:Hallucinations: Auditory  Ideas of Reference:None  Suicidal Thoughts:Suicidal Thoughts: Yes, Active  Homicidal Thoughts:Homicidal Thoughts: No   Sensorium  Memory: Immediate Good  Judgment: Poor  Insight: Poor   Executive Functions  Concentration: Poor  Attention Span: Poor  Recall: [place] of Knowledge: Fair  Language: Fair   Psychomotor Activity  Psychomotor Activity: Psychomotor Activity: Normal   Assets  Assets: Social Support; Housing   Sleep  Sleep: Sleep: Poor   Physical Exam: Physical  Exam HENT:     Head: Normocephalic.  Eyes:     Pupils: Pupils are equal, round, and reactive to light.  Musculoskeletal:        General: Normal range of motion.    Review of Systems  Constitutional: Negative.   HENT: Negative.    Eyes: Negative.   Respiratory: Negative.    Cardiovascular: Negative.   Gastrointestinal: Negative.   Genitourinary: Negative.   Musculoskeletal: Negative.   Skin: Negative.   Neurological: Negative.   Psychiatric/Behavioral:  Positive for depression, hallucinations and substance abuse. Negative for memory loss and suicidal ideas. The patient is nervous/anxious and has insomnia.    Blood pressure 109/71, pulse (!) 110, temperature 98.6 F (37 C), temperature source Oral, resp. rate 16, height 5' 4"$  (1.626 m), weight 56.2 kg, SpO2 100 %. Body mass index is 21.28 kg/m.  Mental Status Per Nursing Assessment::   On Admission:  Suicidal ideation indicated by patient, Thoughts of violence towards others  Demographic Factors:  Male, Adolescent or young adult, and Low socioeconomic status  Loss Factors: Financial problems/change in socioeconomic status  Historical Factors: Family history of mental illness or substance abuse  Risk Reduction Factors:   Employed, Living with another person, especially a relative, and Positive social support  Continued Clinical Symptoms:  Alcohol/Substance Abuse/Dependencies  Cognitive Features That Contribute To Risk:  None    Suicide Risk:  Moderate:  Frequent suicidal ideation with limited intensity, and duration, some specificity in terms of plans, no associated intent, good self-control, limited dysphoria/symptomatology, some risk factors present, and identifiable protective factors, including available and accessible social support.   Follow-up Information     [facility] [place] Follow up.   Contact information: [place] [id number] [place], NP 05/23/2022, 6:43 PM

## 2022-05-23 NOTE — H&P (Addendum)
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  Reason for admission: Jeremy Cooper is a 22 yo [place] American male with a history of THC dependence who was taken to the [facility]) by his mother on 05/22/2022 for evaluation of auditory hallucinations in the context of medication non compliance. While at the [facility], pt also reported +SI & +HI, was guarded and would not disclose his plans.  Patient was deemed to be at risk of danger to self and others, and involuntarily committed prior to transfer by the [facility] [employer] to this behavioral [facility] for treatment and stabilization of his mental status.  Mode of transport to Hospital: [facility]. Current Outpatient (Home) Medication List: Risperdal, but not taking PRN medication prior to evaluation: Milk of Magnesia, Maalox, Tylenol.  ED course: IVC'd prior to transfer  Collateral Information: Mother Donny [place]-[phone]-Verbal consent obtained from patient's mother, and call placed tomorrow for collateral information.  Patient's mother reports that auditory hallucinations started approximately 5 years ago.  She shared that at that time, patient stated that the neighbors were shouting at him through the walls and "talking junk to him through the walls".  She reports that patient was placed on Zyprexa which was not helping initially, and medication was changed to Risperdal in April of last year. She states that pt was taking  the Risperdal, and it was helping, but it eventually stopped working, and pt stopped the medication a month ago.   Pt's mother states that prior to this hospitalization, pt reported to her that he was "hearing voices talking to him outside of his head, and asked me if I could hear the voices". Mother reports that pt was up for most of the night, prior to the presentation at the [facility] shouting at the voices, and saying, things such as "I'm ma fuck you up!" To the voices,and she was able to calm him down enough to take him to the [facility] for evaluation.    Pt's mother reports that over the past month since starting him new job at [employer] on 1/2, pt has had diminished concentration levels, poor appetite, poor energy levels & poor sleep. Pt's mother shares that pt works at [employer] and works an overnight shift which he enjoys because he gets paid extra. She reports that pt has a history of smoking marijuana, did not smoke for a long time, but resumed smoking 2-3 weeks ago. Pt's mother states that the psychosis has worsened over the past month, and she feels as though the underlying issue is depression. Pt's mother states that pt sees Dr. Nicole Cella with the [facility] behavioral health at [facility] for medication management, and sees "Shawn" for therapy. Pt's mother states that she plans to reiterate the need for pt to be forthcoming in providing information regarding, the symptoms that he is experiencing when she visits him tonight. Pt's mother states that pt has no friends, and goes to work and comes back home, and was "a very smart kid in school and always took advanced classes". She reports that pt had one episode of visual hallucinations last year where he could see a lady,  but has not had that since then.  Patient's mother reports that his auditory hallucinations "come and go".  POA/Legal Guardian: Patient is his own guardian  History of Present Illness: During encounter with patient, he reports that auditory  hallucinations have been ongoing for "many years", he is guarded, seems not to be forthcoming in his reports of what his psychosis consist of.  He provides contradictory information as compared to information that he provided while at the [facility] He states that he is unable to make out what the voices state, but had reported on initial presentation to the [facility] that the voices are commanding in nature, even though he did not provide specifics. Pt is evasive in his reports of symptoms, and is preoccupied with wanting to be discharged, states repeatedly "I wasn't supposed to be sent here in the first place".  Patient reports that in the past, he has thought that other people knew what he was thinking, but denies feeling that way currently.  Patient denies any current or past symptoms of Depression, Bipolar, Anxiety, panic attacks, PTSD, OCD type symptoms, but again, it is possible that he is not being forthcoming with reporting of his symptoms for fear of being hospitalized for longer.  We will continue to assess symptoms on a daily basis.  Current Presentation: Pt with flat affect and depressed mood, attention to personal hygiene and grooming is fair, eye contact is poor, speech is clear & coherent. Thought contents are organized and logical, and pt currently denies SI/HI/AVH or paranoia.  He does admit to hearing voices prior to hospitalization.  Past Psychiatric Hx: Previous Psych Diagnoses: " Delusional disorder" Prior inpatient treatment: Denies Current/prior outpatient treatment[facility], Dr. De Nurse Prior rehab hx: Denies Psychotherapy hx: "Shawn" History of suicide attempts: Denies History of homicide or aggression: Denies Psychiatric medication history: Zyprexa and Risperdal Psychiatric medication compliance history: Reports compliance Neuromodulation history: Denies Current Psychiatrist: Dr. De Nurse Current therapist: "Shawn"  Substance Abuse Hx: Alcohol: Denies  use Tobacco: Denies use Illicit drugs: THC since age 36, currently smokes 1 blunt daily. Rx drug abuse: Denies Rehab hx: Denies  Past Medical History: Medical Diagnoses: Denies Home Rx: Denies Prior Hosp: Denies Prior Surgeries/Trauma: Denies Head trauma, LOC, concussions, seizures: Concussion while playing football in middle school.  Self-injurious behaviors via cutting self in middle school, but reports stopping in middle school Allergies: Denies LMP: N/A Contraception: Denies PCP: None  Family History: Medical: Denies Psych: Denies Psych Rx: Denies SA/HA: Denies Substance use family hx: Denies  Social History: Patient reports highest level of education has been 12 grade, states that he was raised by his mother with whom he currently lives.  Reports that he has 1 sister.  Reports being single and has no children.  Works at [employer].  Is the patient at risk to self? Yes.    Has the patient been a risk to self in the past 6 months? Yes.    Has the patient been a risk to self within the distant past? Yes.    Is the patient a risk to others? Yes.    Has the patient been a risk to others in the past 6 months? Yes.    Has the patient been a risk to others within the distant past? No.   [place] Scale:  [place] Admission (Current) from 05/22/2022 in [facility] Most recent reading at 05/22/2022 11:00 PM ED from 05/22/2022 in [facility] Most recent reading at  05/22/2022 12:48 PM Office Visit from 01/24/2022 in [place] at [facility] Most recent reading at 01/24/2022  2:57 PM  C-SSRS RISK CATEGORY Moderate Risk Moderate Risk No Risk      Alcohol Screening: Patient refused Alcohol Screening Tool: Yes 1. How often do you have a drink containing alcohol?: Monthly or less 2. How many drinks containing alcohol do you have on a typical day when you are drinking?: 3 or 4 3. How  often do you have six or more drinks on one occasion?: Never AUDIT-C Score: 2 Alcohol Brief Interventions/Follow-up: Alcohol education/Brief advice Substance Abuse History in the last 12 months:  Yes.   Consequences of Substance Abuse: Medical Consequences:  worsening of psychosis Previous Psychotropic Medications: Yes  Psychological Evaluations: No  Past Medical History: History reviewed. No pertinent past medical history. History reviewed. No pertinent surgical history. Family History: History reviewed. No pertinent family history. Family Psychiatric  History: denies  Tobacco Screening:  Social History   Tobacco Use  Smoking Status Unknown  Smokeless Tobacco Not on file    [facility] Tobacco Counseling     Are you interested in Tobacco Cessation Medications?  No, patient refused Counseled patient on smoking cessation:  Refused/Declined practical counseling Reason Tobacco Screening Not Completed: Patient Refused Screening       Social History:  Social History   Substance and Sexual Activity  Alcohol Use Not Currently     Social History   Substance and Sexual Activity  Drug Use Not on file    Additional Social History: Marital status: Single Are you sexually active?: No What is your sexual orientation?: Heterosexual Has your sexual activity been affected by drugs, alcohol, medication, or emotional stress?: N/A Does patient have children?: No      Allergies:  No Known Allergies Lab Results:  Results for orders placed or performed during the hospital encounter of 05/22/22 (from the past 48 hour(s))  Resp panel by RT-PCR (RSV, Flu A&B, Covid) Anterior Nasal Swab     Status: None   Collection Time: 05/22/22  2:20 PM   Specimen: Anterior Nasal Swab  Result Value Ref Range   SARS Coronavirus 2 by RT PCR NEGATIVE NEGATIVE   Influenza A by PCR NEGATIVE NEGATIVE   Influenza B by PCR NEGATIVE NEGATIVE    Comment: (NOTE) The Xpert Xpress SARS-CoV-2/FLU/RSV plus assay is intended  as an aid in the diagnosis of influenza from Nasopharyngeal swab specimens and should not be used as a sole basis for treatment. Nasal washings and aspirates are unacceptable for Xpert Xpress SARS-CoV-2/FLU/RSV testing.  Fact Sheet for Patients: EntrepreneurPulse.com.au  Fact Sheet for Healthcare Providers: IncredibleEmployment.be  This test is not yet approved or cleared by the [place] FDA and has been authorized for detection and/or diagnosis of SARS-CoV-2 by FDA under an Emergency Use Authorization (EUA). This EUA will remain in effect (meaning this test can be used) for the duration of the COVID-19 declaration under Section 564(b)(1) of the Act, 21 U.S.C. section 360bbb-3(b)(1), unless the authorization is terminated or revoked.     Resp Syncytial Virus by PCR NEGATIVE NEGATIVE    Comment: (NOTE) Fact Sheet for Patients: EntrepreneurPulse.com.au  Fact Sheet for Healthcare Providers: IncredibleEmployment.be  This test is not yet approved or cleared by the [place] FDA and has been authorized for detection and/or diagnosis of SARS-CoV-2 by FDA under an Emergency Use Authorization (EUA). This EUA will remain in effect (meaning this test can be used) for the duration of the  COVID-19 declaration under Section 564(b)(1) of the Act, 21 U.S.C. section 360bbb-3(b)(1), unless the authorization is terminated or revoked.  Performed at [facility], [place] [address], [place], [place] [address]   Hemoglobin A1c     Status: None   Collection Time: 05/22/22  2:20 PM  Result Value Ref Range   Hgb A1c MFr Bld 4.9 4.8 - 5.6 %    Comment: (NOTE) Pre diabetes:          5.7%-6.4%  Diabetes:              >6.4%  Glycemic control for   <7.0% adults with diabetes    Mean Plasma Glucose 93.93 mg/dL    Comment: Performed at [facility] [address] [place], [place] [address]  TSH     Status:  None   Collection Time: 05/22/22  2:20 PM  Result Value Ref Range   TSH 1.631 0.350 - 4.500 uIU/mL    Comment: Performed by a 3rd Generation assay with a functional sensitivity of <=0.01 uIU/mL. Performed at [facility], [place] [address] [place], [place] [address]   Lipid panel     Status: Abnormal   Collection Time: 05/22/22  2:20 PM  Result Value Ref Range   Cholesterol 164 0 - 200 mg/dL   Triglycerides 64 <150 mg/dL   HDL 37 (L) >40 mg/dL   Total CHOL/HDL Ratio 4.4 RATIO   VLDL 13 0 - 40 mg/dL   LDL Cholesterol 114 (H) 0 - 99 mg/dL    Comment:        Total Cholesterol/HDL:CHD Risk Coronary Heart Disease Risk Table                     Men   Women  1/2 Average Risk   3.4   3.3  Average Risk       5.0   4.4  2 X Average Risk   9.6   7.1  3 X Average Risk  23.4   11.0        Use the calculated Patient Ratio above and the CHD Risk Table to determine the patient's CHD Risk.        ATP III CLASSIFICATION (LDL):  <100     mg/dL   Optimal  100-129  mg/dL   Near or Above                    Optimal  130-159  mg/dL   Borderline  160-189  mg/dL   High  >190     mg/dL   Very High Performed at [facility] [address], [place], [place] [address]   POCT Urine Drug Screen - (I-Screen)     Status: Abnormal   Collection Time: 05/22/22  2:20 PM  Result Value Ref Range   POC Amphetamine UR None Detected NONE DETECTED (Cut Off Level 1000 ng/mL)   POC Secobarbital (BAR) None Detected NONE DETECTED (Cut Off Level 300 ng/mL)   POC Buprenorphine (BUP) None Detected NONE DETECTED (Cut Off Level 10 ng/mL)   POC Oxazepam (BZO) None Detected NONE DETECTED (Cut Off Level 300 ng/mL)   POC Cocaine UR None Detected NONE DETECTED (Cut Off Level 300 ng/mL)   POC Methamphetamine UR None Detected NONE DETECTED (Cut Off Level 1000 ng/mL)   POC Morphine None Detected NONE DETECTED (Cut Off Level 300 ng/mL)   POC Methadone UR None Detected NONE DETECTED (Cut Off Level 300  ng/mL)   POC  Oxycodone UR None Detected NONE DETECTED (Cut Off Level 100 ng/mL)   POC Marijuana UR Positive (A) NONE DETECTED (Cut Off Level 50 ng/mL)  POC SARS Coronavirus 2 Ag     Status: None   Collection Time: 05/22/22  2:29 PM  Result Value Ref Range   SARSCOV2ONAVIRUS 2 AG NEGATIVE NEGATIVE    Comment: (NOTE) SARS-CoV-2 antigen NOT DETECTED.   Negative results are presumptive.  Negative results do not preclude SARS-CoV-2 infection and should not be used as the sole basis for treatment or other patient management decisions, including infection  control decisions, particularly in the presence of clinical signs and  symptoms consistent with COVID-19, or in those who have been in contact with the virus.  Negative results must be combined with clinical observations, patient history, and epidemiological information. The expected result is Negative.  Fact Sheet for Patients: HandmadeRecipes.com.cy  Fact Sheet for Healthcare Providers: FuneralLife.at  This test is not yet approved or cleared by the [place] FDA and  has been authorized for detection and/or diagnosis of SARS-CoV-2 by FDA under an Emergency Use Authorization (EUA).  This EUA will remain in effect (meaning this test can be used) for the duration of  the COV ID-19 declaration under Section 564(b)(1) of the Act, 21 U.S.C. section 360bbb-3(b)(1), unless the authorization is terminated or revoked sooner.    CBC with Differential/Platelet     Status: None   Collection Time: 05/22/22  2:43 PM  Result Value Ref Range   WBC 5.5 4.0 - 10.5 K/uL   RBC 4.72 4.22 - 5.81 MIL/uL   Hemoglobin 14.0 13.0 - 17.0 g/dL   HCT 40.1 39.0 - 52.0 %   MCV 85.0 80.0 - 100.0 fL   MCH 29.7 26.0 - 34.0 pg   MCHC 34.9 30.0 - 36.0 g/dL   RDW 12.6 11.5 - 15.5 %   Platelets 268 150 - 400 K/uL   nRBC 0.0 0.0 - 0.2 %   Neutrophils Relative % 34 %   Neutro Abs 1.8 1.7 - 7.7 K/uL   Lymphocytes Relative 57 %    Lymphs Abs 3.1 0.7 - 4.0 K/uL   Monocytes Relative 7 %   Monocytes Absolute 0.4 0.1 - 1.0 K/uL   Eosinophils Relative 2 %   Eosinophils Absolute 0.1 0.0 - 0.5 K/uL   Basophils Relative 0 %   Basophils Absolute 0.0 0.0 - 0.1 K/uL   Immature Granulocytes 0 %   Abs Immature Granulocytes 0.01 0.00 - 0.07 K/uL    Comment: Performed at [facility], [address], [place], [place] [address]  Comprehensive metabolic panel     Status: None   Collection Time: 05/22/22  2:43 PM  Result Value Ref Range   Sodium 139 135 - 145 mmol/L   Potassium 3.5 3.5 - 5.1 mmol/L   Chloride 102 98 - 111 mmol/L   CO2 26 22 - 32 mmol/L   Glucose, Bld 95 70 - 99 mg/dL    Comment: Glucose reference range applies only to samples taken after fasting for at least 8 hours.   BUN 9 6 - 20 mg/dL   Creatinine, Ser 0.85 0.61 - 1.24 mg/dL   Calcium 9.3 8.9 - 10.3 mg/dL   Total Protein 6.9 6.5 - 8.1 g/dL   Albumin 4.3 3.5 - 5.0 g/dL   AST 25 15 - 41 U/L   ALT 13 0 - 44 U/L   Alkaline Phosphatase 64 38 - 126 U/L  Total Bilirubin 0.4 0.3 - 1.2 mg/dL   GFR, Estimated >60 >60 mL/min    Comment: (NOTE) Calculated using the CKD-EPI Creatinine Equation (2021)    Anion gap 11 5 - 15    Comment: Performed at [facility] [address], [place], [place] [address]    Blood Alcohol level:  Lab Results  Component Value Date   [facility] <10 07/28/2021   ETH <10 [id number]    Metabolic Disorder Labs:  Lab Results  Component Value Date   HGBA1C 4.9 05/22/2022   MPG 93.93 05/22/2022   MPG 93.93 07/28/2021   No results found for: "PROLACTIN" Lab Results  Component Value Date   CHOL 164 05/22/2022   TRIG 64 05/22/2022   HDL 37 (L) 05/22/2022   CHOLHDL 4.4 05/22/2022   VLDL 13 05/22/2022   LDLCALC 114 (H) 05/22/2022    Current Medications: Current Facility-Administered Medications  Medication Dose Route Frequency Provider Last Rate Last Admin   acetaminophen (TYLENOL) tablet 650 mg  650 mg Oral Q6H  PRN Evette Georges, NP       alum & mag hydroxide-simeth (MAALOX/MYLANTA) 200-200-20 MG/5ML suspension 30 mL  30 mL Oral Q4H PRN Evette Georges, NP       alum & mag hydroxide-simeth (MAALOX/MYLANTA) 200-200-20 MG/5ML suspension 30 mL  30 mL Oral Q4H PRN Evette Georges, NP       hydrOXYzine (ATARAX) tablet 25 mg  25 mg Oral Q6H PRN Nicholes Rough, NP       OLANZapine zydis (ZYPREXA) disintegrating tablet 10 mg  10 mg Oral Q8H PRN Evette Georges, NP       And   LORazepam (ATIVAN) tablet 1 mg  1 mg Oral PRN Evette Georges, NP       And   ziprasidone (GEODON) injection 20 mg  20 mg Intramuscular PRN Evette Georges, NP       magnesium hydroxide (MILK OF MAGNESIA) suspension 30 mL  30 mL Oral Daily PRN Evette Georges, NP       magnesium hydroxide (MILK OF MAGNESIA) suspension 30 mL  30 mL Oral Daily PRN Evette Georges, NP       risperiDONE (RISPERDAL) tablet 2 mg  2 mg Oral BID Nicholes Rough, NP       traZODone (DESYREL) tablet 50 mg  50 mg Oral QHS PRN Nicholes Rough, NP       PTA Medications: Medications Prior to Admission  Medication Sig Dispense Refill Last Dose   risperiDONE (RISPERDAL) 2 MG tablet Take 1 tablet (2 mg total) by mouth 2 (two) times daily. 60 tablet 2    Musculoskeletal: Strength & Muscle Tone: within normal limits Gait & Station: normal Patient leans: N/A Psychiatric Specialty Exam:  Presentation  General Appearance:  Appropriate for Environment; Fairly Groomed  Eye Contact: Poor  Speech: Clear and Coherent  Speech Volume: Decreased  Handedness: Right   Mood and Affect  Mood: Depressed; Dysphoric  Affect: Congruent   Thought Process  Thought Processes: Coherent  Duration of Psychotic Symptoms: >5 yrs Past Diagnosis of Schizophrenia or Psychoactive disorder: No  Descriptions of Associations:Intact  Orientation:Full (Time, Place and Person)  Thought Content:Logical  Hallucinations:Hallucinations: Auditory  Ideas of Reference:None  Suicidal  Thoughts:Suicidal Thoughts: Yes, Active  Homicidal Thoughts:Homicidal Thoughts: No  Sensorium  Memory: Immediate Good  Judgment: Poor  Insight: Poor  Executive Functions  Concentration: Poor  Attention Span: Poor  Recall: [place] of Knowledge: Fair  Language: Fair  Psychomotor Activity  Psychomotor Activity: Psychomotor  Activity: Normal  Assets  Assets: Social Support; Housing  Sleep  Sleep: Sleep: Poor  Physical Exam: Physical Exam HENT:     Nose: Nose normal.  Musculoskeletal:     Cervical back: Normal range of motion.  Neurological:     Mental Status: He is alert and oriented to person, place, and time.    Review of Systems  Constitutional: Negative.   HENT: Negative.    Eyes: Negative.   Respiratory: Negative.    Cardiovascular: Negative.   Gastrointestinal: Negative.   Genitourinary: Negative.   Musculoskeletal:  Negative for myalgias.  Skin: Negative.   Neurological: Negative.   Psychiatric/Behavioral:  Positive for depression, hallucinations and substance abuse. Negative for memory loss and suicidal ideas. The patient is nervous/anxious and has insomnia.    Blood pressure 109/71, pulse (!) 110, temperature 98.6 F (37 C), temperature source Oral, resp. rate 16, height 5' 4"$  (1.626 m), weight 56.2 kg, SpO2 100 %. Body mass index is 21.28 kg/m. Treatment Plan Summary: Daily contact with patient to assess and evaluate symptoms and progress in treatment and Medication management  Observation Level/Precautions:  15 minute checks  Laboratory:  Labs reviewed   Psychotherapy:  Unit Group sessions  Medications:  See [facility]  Consultations:  To be determined   Discharge Concerns:  Safety, medication compliance, mood stability  Estimated LOS: 5-7 days  Other:  N/A   Labs reviewed on 05/23/2022: CMP WNL.  CBC WNL.  Respiratory panel negative.  UDS positive for THC.  Lipid panel with HDL slightly low at 37 and LDL slightly elevated at 114.   Educated on healthy food choices and exercise.  TSH WNL.  Hemoglobin A1c WNL.   Labs ordered: Vitamins B1, D, B12.  Baseline UA ordered.  Baseline EKG ordered.  PLAN Safety and Monitoring: Voluntary admission to inpatient psychiatric unit for safety, stabilization and treatment Daily contact with patient to assess and evaluate symptoms and progress in treatment Patient's case to be discussed in multi-disciplinary team meeting Observation Level : q15 minute checks Vital signs: q12 hours Precautions: Safety  Long Term Goal(s): Improvement in symptoms so as ready for discharge  Short Term Goals: Ability to identify changes in lifestyle to reduce recurrence of condition will improve, Ability to verbalize feelings will improve, Ability to disclose and discuss suicidal ideas, Ability to identify and develop effective coping behaviors will improve, Ability to maintain clinical measurements within normal limits will improve, and Ability to identify triggers associated with substance abuse/mental health issues will improve  Diagnoses Principal Problem:   Undifferentiated schizophrenia ([place]) Active Problems:   Insomnia   Delta-9-tetrahydrocannabinol (THC) dependence ([facility])   Anxiety state  Medications -Start Risperdal 2 mg twice daily for psychosis/mood stabilization -Start trazodone 50 mg nightly as needed for sleep -Start hydroxyzine 25 mg 3 times daily as needed for anxiety  Other PRNS -Continue Tylenol 650 mg every 6 hours PRN for mild pain -Continue Maalox 30 mg every 4 hrs PRN for indigestion -Continue Imodium 2-4 mg as needed for diarrhea -Continue Milk of Magnesia as needed every 6 hrs for constipation -Continue Zofran disintegrating tabs every 6 hrs PRN for nausea   Discharge Planning: Social work and case management to assist with discharge planning and identification of hospital follow-up needs prior to discharge Estimated LOS: 5-7 days Discharge Concerns: Need to establish a  safety plan; Medication compliance and effectiveness Discharge Goals: Return home with outpatient referrals for mental health follow-up including medication management/psychotherapy  I certify that inpatient services furnished can reasonably  be expected to improve the patient's condition.    Nicholes Rough, [place] 2/13/20246:39 PM

## 2022-05-23 NOTE — BHH Counselor (Signed)
Adult Comprehensive Assessment  Patient ID: Jeremy Cooper, male   DOB: [birth year], 22 y.o.   MRN: [id number]  Information Source: Information source: Patient  Current Stressors:  Patient states their primary concerns and needs for treatment are:: "Auditory Hallucinations, Depression, Anxiety" Patient states their goals for this hospitilization and ongoing recovery are:: "To go home" Educational / Learning stressors: Pt reports having a 12th grade education Employment / Job issues: Pt reports working at [employer]: Pt reports having no relationship with his father Museum/gallery curator / Lack of resources (include bankruptcy): Pt reports no stressors Housing / Lack of housing: Pt reports living in an apartment with his mother Physical health (include injuries & life threatening diseases): Pt reports no stressors Social relationships: Pt reports having few social supports 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 Bereavement / Loss: Pt reports no stressors  Living/Environment/Situation:  Living Arrangements: Parent Living conditions (as described by patient or guardian): [place]/Apartment Who else lives in the home?: Mother How long has patient lived in current situation?: 2 years What is atmosphere in current home: Comfortable, Supportive  Family History:  Marital status: Single Are you sexually active?: No What is your sexual orientation?: Heterosexual Has your sexual activity been affected by drugs, alcohol, medication, or emotional stress?: N/A Does patient have children?: No  Childhood History:  By whom was/is the patient raised?: Mother Additional childhood history information: Mother raised him. Not much interaction with biological father. Patient describes childhood as "not the worse, but good." Description of patient's relationship with caregiver when they were a child: "I had a good relationship with my mother" Patient's description of current  relationship with people who raised him/her: "It is still a good relationship with my mother" How were you disciplined when you got in trouble as a child/adolescent?: spanked, grounded, put the corner, sent to his room Does patient have siblings?: Yes Number of Siblings: 6 Description of patient's current relationship with siblings: "I have 4 brothers and 2 sisters and we get along well and have a pretty easy relationship" Did patient suffer any verbal/emotional/physical/sexual abuse as a child?: No Did patient suffer from severe childhood neglect?: No Has patient ever been sexually abused/assaulted/raped as an adolescent or adult?: No Was the patient ever a victim of a crime or a disaster?: No Witnessed domestic violence?: No Has patient been affected by domestic violence as an adult?: No  Education:  Highest grade of school patient has completed: 12th grade Currently a student?: No Learning disability?: No  Employment/Work Situation:   Employment Situation: Employed Where is Patient Currently Employed?: [employer] How Long has Patient Been Employed?: 2 months Are You Satisfied With Your Job?: Yes Do You Work More Than One Job?: No Work Stressors: none reported Patient's Job has Been Impacted by Current Illness: No What is the Longest Time Patient has Held a Job?: 10 months Where was the Patient Employed at that Time?: [employer] Has Patient ever Been in the [employer]?: No  Financial Resources:   Financial resources: Income from employment Does patient have a representative payee or guardian?: No  Alcohol/Substance Abuse:   What has been your use of drugs/alcohol within the last 12 months?: Pt reports using Marijuana daily If attempted suicide, did drugs/alcohol play a role in this?: No Alcohol/Substance Abuse Treatment Hx: Denies past history Has alcohol/substance abuse ever caused legal problems?: No  Social Support System:   Pensions consultant Support System: Poor Describe  Community Support System: "My mother" Type of faith/religion: "Christian" How does patient's faith  help to cope with current illness?: "Prayer"  Leisure/Recreation:   Do You Have Hobbies?: Yes Leisure and Hobbies: "Exercise"  Strengths/Needs:   What is the patient's perception of their strengths?: "I am caring" Patient states they can use these personal strengths during their treatment to contribute to their recovery: "I don't know" Patient states these barriers may affect/interfere with their treatment: None Patient states these barriers may affect their return to the community: None Other important information patient would like considered in planning for their treatment: None  Discharge Plan:   Currently receiving community mental health services: No Patient states concerns and preferences for aftercare planning are: Pt is interested in therapy and medication management Patient states they will know when they are safe and ready for discharge when: "I know I am ready now" Does patient have access to transportation?: Yes ("I share a car with my mother") Does patient have financial barriers related to discharge medications?: Yes Patient description of barriers related to discharge medications: No medical insurance Will patient be returning to same living situation after discharge?: Yes  Summary/Recommendations:   Summary and Recommendations (to be completed by the evaluator): Jeremy Cooper is a 22 year old, male, who was admitted to the hospital due to Auditory Hallucinations, depression, anxiety, and suicidal thoughts.  The Pt currently denies any suicidal thoughts prior to admission.  The Pt reports that he has been experiencing AH for approximately 2 years.  The Pt reports that he is living with his mother and is close to his 6 siblings.  He states that he does not have a relationship with his father at this time.  He denies any childhood trauma or abuse. The Pt reports having a 12th  grade education and working at [employer].  He states that he does not have any medical insurance at this time.  The Pt reports using Marijuana daily and denies any other form of substance use or substance use treatment. While in the hospital the Pt can benefit from crisis stabilization, medication evaluation, group therapy, psycho-education, case management, and discharge planning. Upon discharge the Pt would like to return home with his mother. It is recommended that the Pt follow-up with a local outpatient provider for therapy and medication management. It is also recommended that the Pt continue to take all medications as prescribed until directed to do otherwise by his providers.  At discharge it is recommended that the patient adhere to the established aftercare plan.  Darleen Crocker. 05/23/2022

## 2022-05-23 NOTE — B[facility] Group Notes (Signed)
Adult Psychoeducational Group Note  Date:  05/23/2022 Time:  3:34 PM  Group Topic/Focus:  Goals Group:   The focus of this group is to help patients establish daily goals to achieve during treatment and discuss how the patient can incorporate goal setting into their daily lives to aide in recovery.  Participation Level:  Active  Participation Quality:  Appropriate  Affect:  Appropriate  Cognitive:  Appropriate  Insight: Appropriate  Engagement in Group:  Engaged  Modes of Intervention:  Discussion  Additional Comments:  Patient was engaged throughout the entirety of Goals Group.  Jeremy Cooper December 05/23/2022, 3:34 PM

## 2022-05-23 NOTE — Progress Notes (Signed)
Pt has spent the majority of the day in his room. Upon assessment this morning pt denies SI/Hi and a/v hallucinations. Pt focused on discharge and asking when he can leave.  Pt observed tearful after being told he was not discharging today. Pt refused lunch and dinner. Try put in room. Pt offered fluids, declining at this time also. Affect irritable. Q 15 minute checks ongoing for safety.

## 2022-05-23 NOTE — Group Note (Signed)
LCSW Group Therapy Note   Group Date: 05/23/2022 Start Time: 1100 End Time: 1200  Type of Therapy: Group Therapy: Boundaries  Participation Level: Did Not Attend  Description of Group: This group will address the use of boundaries in their personal lives. Patients will explore why boundaries are important, the difference between healthy and unhealthy boundaries, and negative and positive outcomes of different boundaries and will look at how boundaries can be crossed.  Patients will be encouraged to identify current boundaries in their own lives and identify what kind of boundary is being set. Facilitators will guide patients in utilizing problem-solving interventions to address and correct types of boundaries being used and to address when no boundary is being used. Understanding and applying boundaries will be explored and addressed for obtaining and maintaining a balanced life. Patients will be encouraged to explore ways to assertively make their boundaries and needs known to significant others in their lives, using other group members and facilitator for role play, support, and feedback.   Therapeutic Goals: 1. Patient will identify areas in their life where setting clear boundaries could be used to improve their life.  2. Patient will identify signs/triggers that a boundary is not being respected. 3. Patient will identify two ways to set boundaries in order to achieve balance in their lives: 4. Patient will demonstrate ability to communicate their needs and set boundaries through discussion and/or role plays  Summary of Progress/Problems: Did not attend   Darleen Crocker, [place] 05/23/2022  1:18 PM

## 2022-05-23 NOTE — B[facility] Suicide Risk Assessment (Signed)
[facility] INPATIENT:  Family/Significant Other Suicide Prevention Education  Suicide Prevention Education:  Education Completed; [facility] [phone] (Mother) has been identified by the patient as the family member/significant other with whom the patient will be residing, and identified as the person(s) who will aid the patient in the event of a mental health crisis (suicidal ideations/suicide attempt).  With written consent from the patient, the family member/significant other has been provided the following suicide prevention education, prior to the and/or following the discharge of the patient.  The suicide prevention education provided includes the following: Suicide risk factors Suicide prevention and interventions National Suicide Hotline telephone number [facility] assessment telephone number [facility] Emergency Assistance [place] and/or Residential Mobile Crisis Unit telephone number  Request made of family/significant other to: Remove weapons (e.g., guns, rifles, knives), all items previously/currently identified as safety concern.   Remove drugs/medications (over-the-counter, prescriptions, illicit drugs), all items previously/currently identified as a safety concern.  The family member/significant other verbalizes understanding of the suicide prevention education information provided.  The family member/significant other agrees to remove the items of safety concern listed above.  CSW spoke with Ms. Hills who confirms that her son does live with her and that he can return to the apartment with her after discharge.  She states that there are no firearms or weapons in the home.  She states that she has "completed his FMLA paperwork and I want him to have a better outpatient therapist and psychiatrist when he leaves there".  She states that he was previously being seen at [facility] in [place] but does not want him to return to this provider.   CSW completed SPE with Ms. Hills.   Frutoso Chase Manases [place] 05/23/2022, 9:45 AM

## 2022-05-24 ENCOUNTER — Encounter (HOSPITAL_COMMUNITY): Payer: Self-pay

## 2022-05-24 LAB — URINALYSIS, ROUTINE W REFLEX MICROSCOPIC
Bacteria, UA: NONE SEEN
Bilirubin Urine: NEGATIVE
Glucose, UA: NEGATIVE mg/dL
Hgb urine dipstick: NEGATIVE
Ketones, ur: NEGATIVE mg/dL
Leukocytes,Ua: NEGATIVE
Nitrite: NEGATIVE
Protein, ur: NEGATIVE mg/dL
Specific Gravity, Urine: 1.01 (ref 1.005–1.030)
pH: 7 (ref 5.0–8.0)

## 2022-05-24 LAB — VITAMIN B12: Vitamin B-12: 325 pg/mL (ref 180–914)

## 2022-05-24 MED ORDER — HALOPERIDOL 5 MG PO TABS
5.0000 mg | ORAL_TABLET | Freq: Two times a day (BID) | ORAL | Status: DC
  Administered 2022-05-25 – 2022-05-26 (×2): 5 mg via ORAL
  Filled 2022-05-24 (×7): qty 1

## 2022-05-24 NOTE — Group Note (Signed)
Recreation Therapy Group Note   Group Topic:Other  Group Date: 05/24/2022 Start Time: 0930 End Time: 1000 Facilitators: Madix Blowe-McCall, LRT,CTRS Location: [address] Day[address]   Goal Area(s) Addresses: Patient will actively participate in group activity. Patient will display ability to work with peers in activity.   Group Description: Valentine's Trivia.  LRT asked patients questions dealing with romantic movies and Valentine's in general in celebration of the holiday.  Patients had the choice of working individually or in small groups to come up with answers to the questions.    Affect/Mood: Flat   Participation Level: Active   Participation Quality: Independent   Behavior: Appropriate   Speech/Thought Process: Focused   Insight: Good   Judgement: Good   Modes of Intervention: Activity   Patient Response to Interventions:  Engaged   Education Outcome:  Acknowledges education and In group clarification offered    Clinical Observations/Individualized Feedback: Pt was quiet but active in the activity.  Pt worked alone in coming up with his answers to the questions.  Pt was appropriate and attentive throughout activity.     Plan: Continue to engage patient in RT group sessions 2-3x/week.   Samantha Ragen-McCall, LRT,CTRS 05/24/2022 1:05 PM

## 2022-05-24 NOTE — Plan of Care (Signed)
  Problem: Health Behavior/Discharge Planning: Goal: Compliance with treatment plan for underlying cause of condition will improve Outcome: Progressing   Problem: Safety: Goal: Periods of time without injury will increase Outcome: Progressing   

## 2022-05-24 NOTE — Group Note (Unsigned)
Date:  05/24/2022 Time:  10:01 AM  Group Topic/Focus:  Orientation:   The focus of this group is to educate the patient on the purpose and policies of crisis stabilization and provide a format to answer questions about their admission.  The group details unit policies and expectations of patients while admitted.     Participation Level:  {BHH PARTICIPATION WO:[id number]  Participation Quality:  {BHH PARTICIPATION QUALITY:22265}  Affect:  {BHH AFFECT:22266}  Cognitive:  {BHH COGNITIVE:22267}  Insight: {BHH Insight2:20797}  Engagement in Group:  {BHH ENGAGEMENT IN BP:[id number]  Modes of Intervention:  {BHH MODES OF INTERVENTION:22269}  Additional Comments:  ***  Jeremy Cooper 05/24/2022, 10:01 AM

## 2022-05-24 NOTE — Progress Notes (Signed)
[facility] MD Progress Note  05/24/2022 5:23 PM Jeremy Cooper  MRN:  [id number] Principal Problem: Undifferentiated schizophrenia ([place]) Diagnosis: Principal Problem:   Undifferentiated schizophrenia ([place]) Active Problems:   Insomnia   Delta-9-tetrahydrocannabinol (THC) dependence ([place])   Anxiety state  Reason for admission: Jeremy Cooper is a 22 yo [place] American male with a history of THC dependence who was taken to the [facility] [employer] [facility] health [facility]) by his mother on 05/22/2022 for evaluation of auditory hallucinations in the context of medication non compliance. While at the [facility], pt also reported +SI & +HI, was guarded and would not disclose his plans.  Patient was deemed to be at risk of danger to self and others, and involuntarily committed prior to transfer by the [facility] [employer] to this behavioral [facility] for treatment and stabilization of his mental status.   24 hr chart review: Vital signs within normal limits with exception of slightly elevated heart rate of 106 earlier today morning.  Patient compliant with scheduled medications.  Did not require any as needed medications last night or today afternoon.  Patient has attended some unit group activities over the past 24 hours, and has participated.  No behavioral episodes noted in the past 24 hours.  Patient assessment note 05/24/2022: Pt with flat affect and depressed mood, attention to personal hygiene and grooming is fair, eye contact is good, speech is clear & coherent. Thought contents are organized and logical, and pt currently denies SI/HI/AVH.  Patient reports that the last auditory hallucinations that he had were the night prior to admission.  Patient reports a good appetite, reports a good sleep quality last night.  He denies being in any physical pain.  Patient continues to exhibit poor insight, continues to request to be discharged.  Continues to seem not to be forthcoming in reporting of his  symptoms as he is focusing mostly on wanting to be discharged.  As per information gathered from patient's mother yesterday, Risperdal was effective initially, but stopped working a month ago and management of patient's psychosis. We will discontinue Risperdal, and start Haldol 5 mg twice daily for management of psychosis.  Plan is to have patient on Haldol decanoate prior to discharge.  Patient has been educated on this and is agreeable with plan.  Continues hospitalization is necessary at this time to continue to treat psychosis.   No TD/EPS type symptoms found on assessment, and pt denies any feelings of stiffness. AIMS: 0.  Total Time spent with patient: 45 minutes  Past Psychiatric History: See H & P  Past Medical History: History reviewed. No pertinent past medical history. History reviewed. No pertinent surgical history. Family History: History reviewed. No pertinent family history. Family Psychiatric  History: See H & P Social History:  Social History   Substance and Sexual Activity  Alcohol Use Not Currently     Social History   Substance and Sexual Activity  Drug Use Not on file    Social History   Socioeconomic History   Marital status: Single    Spouse name: Not on file   Number of children: Not on file   Years of education: Not on file   Highest education level: Not on file  Occupational History   Not on file  Tobacco Use   Smoking status: Unknown   Smokeless tobacco: Not on file  Substance and Sexual Activity   Alcohol use: Not Currently   Drug use: Not on file   Sexual activity: Not on file  Other Topics Concern   Not on file  Social History Narrative   ** Merged History Encounter **       Social Determinants of Health   Financial Resource Strain: Not on file  Food Insecurity: No Food Insecurity (05/22/2022)   Hunger Vital Sign    Worried About Running Out of Food in the Last Year: Never true    Ran Out of Food in the Last Year: Never true   Transportation Needs: No Transportation Needs (05/22/2022)   PRAPARE - Hydrologist (Medical): No    Lack of Transportation (Non-Medical): No  Physical Activity: Not on file  Stress: Not on file  Social Connections: Not on file   Sleep: Good  Appetite:  Good  Current Medications: Current Facility-Administered Medications  Medication Dose Route Frequency Provider Last Rate Last Admin   acetaminophen (TYLENOL) tablet 650 mg  650 mg Oral Q6H PRN Evette Georges, NP       alum & mag hydroxide-simeth (MAALOX/MYLANTA) 200-200-20 MG/5ML suspension 30 mL  30 mL Oral Q4H PRN Evette Georges, NP       alum & mag hydroxide-simeth (MAALOX/MYLANTA) 200-200-20 MG/5ML suspension 30 mL  30 mL Oral Q4H PRN Evette Georges, NP       haloperidol (HALDOL) tablet 5 mg  5 mg Oral BID Nicholes Rough, NP       hydrOXYzine (ATARAX) tablet 25 mg  25 mg Oral Q6H PRN Nicholes Rough, NP       OLANZapine zydis (ZYPREXA) disintegrating tablet 10 mg  10 mg Oral Q8H PRN Evette Georges, NP       And   LORazepam (ATIVAN) tablet 1 mg  1 mg Oral PRN Evette Georges, NP       And   ziprasidone (GEODON) injection 20 mg  20 mg Intramuscular PRN Evette Georges, NP       magnesium hydroxide (MILK OF MAGNESIA) suspension 30 mL  30 mL Oral Daily PRN Evette Georges, NP       magnesium hydroxide (MILK OF MAGNESIA) suspension 30 mL  30 mL Oral Daily PRN Evette Georges, NP       traZODone (DESYREL) tablet 50 mg  50 mg Oral QHS PRN Nicholes Rough, NP        Lab Results:  Results for orders placed or performed during the hospital encounter of 05/22/22 (from the past 48 hour(s))  Vitamin B12     Status: None   Collection Time: 05/24/22  6:47 AM  Result Value Ref Range   Vitamin B-12 325 180 - 914 pg/mL    Comment: (NOTE) This assay is not validated for testing neonatal or myeloproliferative syndrome specimens for Vitamin B12 levels. Performed at [facility], [place] [address] [place], [place] [address]     Blood Alcohol level:  Lab Results  Component Value Date   ETH <10 07/28/2021   ETH <10 [id number]    Metabolic Disorder Labs: Lab Results  Component Value Date   HGBA1C 4.9 05/22/2022   MPG 93.93 05/22/2022   MPG 93.93 07/28/2021   No results found for: "PROLACTIN" Lab Results  Component Value Date   CHOL 164 05/22/2022   TRIG 64 05/22/2022   HDL 37 (L) 05/22/2022   CHOLHDL 4.4 05/22/2022   VLDL 13 05/22/2022   LDLCALC 114 (H) 05/22/2022    Physical Findings: AIMS:  , ,  ,  ,    CIWA:    COWS:     Musculoskeletal: Strength &  Muscle Tone: within normal limits Gait & Station: normal Patient leans: N/A  Psychiatric Specialty Exam:  Presentation  General Appearance:  Fairly Groomed  Eye Contact: Fair  Speech: Clear and Coherent  Speech Volume: Normal  Handedness: Right   Mood and Affect  Mood: Depressed  Affect: Congruent   Thought Process  Thought Processes: Coherent  Descriptions of Associations:Intact  Orientation:Full (Time, Place and Person)  Thought Content:Logical  History of Schizophrenia/Schizoaffective disorder:No  Duration of Psychotic Symptoms:Greater than six months  Hallucinations:Hallucinations: Auditory  Ideas of Reference:None  Suicidal Thoughts:Suicidal Thoughts: No  Homicidal Thoughts:Homicidal Thoughts: No   Sensorium  Memory: Immediate Good  Judgment: Poor  Insight: Poor   Executive Functions  Concentration: Poor  Attention Span: Fair  Recall: [employer] of Knowledge: Fair  Language: Fair   Psychomotor Activity  Psychomotor Activity: Psychomotor Activity: Normal   Assets  Assets: Communication Skills   Sleep  Sleep: Sleep: Good    Physical Exam: Physical Exam Constitutional:      Appearance: Normal appearance.  HENT:     Head: Normocephalic.     Nose: Nose normal.  Pulmonary:     Effort: Pulmonary effort is normal.   Musculoskeletal:        General: Normal range of motion.  Neurological:     Mental Status: He is alert.    Review of Systems  Constitutional:  Negative for fever.  HENT: Negative.    Eyes: Negative.   Respiratory: Negative.    Cardiovascular: Negative.   Gastrointestinal: Negative.   Genitourinary: Negative.   Musculoskeletal: Negative.   Skin: Negative.   Neurological: Negative.  Negative for dizziness.  Psychiatric/Behavioral:  Positive for depression and substance abuse. Negative for hallucinations, memory loss and suicidal ideas. The patient is nervous/anxious and has insomnia.    Blood pressure (!) 115/50, pulse (!) 106, temperature 98 F (36.7 C), temperature source Oral, resp. rate 18, height 5' 4"$  (1.626 m), weight 56.2 kg, SpO2 100 %. Body mass index is 21.28 kg/m.   Treatment Plan Summary: Treatment Plan Summary: Daily contact with patient to assess and evaluate symptoms and progress in treatment and Medication management   Observation Level/Precautions:  15 minute checks  Laboratory:  Labs reviewed   Psychotherapy:  Unit Group sessions  Medications:  See [facility]  Consultations:  To be determined   Discharge Concerns:  Safety, medication compliance, mood stability  Estimated LOS: 5-7 days  Other:  N/A    Labs reviewed on 05/23/2022: CMP WNL.  CBC WNL.  Respiratory panel negative.  UDS positive for THC.  Lipid panel with HDL slightly low at 37 and LDL slightly elevated at 114.  Educated on healthy food choices and exercise.  TSH WNL.  Hemoglobin A1c WNL.   Labs ordered: Vitamins B1, D, B12.  Baseline UA ordered.  Baseline EKG ordered.   PLAN Safety and Monitoring: Voluntary admission to inpatient psychiatric unit for safety, stabilization and treatment Daily contact with patient to assess and evaluate symptoms and progress in treatment Patient's case to be discussed in multi-disciplinary team meeting Observation Level : q15 minute checks Vital signs: q12  hours Precautions: Safety   Long Term Goal(s): Improvement in symptoms so as ready for discharge   Short Term Goals: Ability to identify changes in lifestyle to reduce recurrence of condition will improve, Ability to verbalize feelings will improve, Ability to disclose and discuss suicidal ideas, Ability to identify and develop effective coping behaviors will improve, Ability to maintain clinical measurements within normal limits will  improve, and Ability to identify triggers associated with substance abuse/mental health issues will improve   Diagnoses Principal Problem:   Undifferentiated schizophrenia ([place]) Active Problems:   Insomnia   Delta-9-tetrahydrocannabinol (THC) dependence ([facility])   Anxiety state   Medications -Discontinue Risperdal 2 mg BID d/t ineffectiveness-has taken med fir >1 yr and it stopped working as per mother -Start Haldol 5 mg BID for psychosis/mood stabilization-Plan is to give LAI prior to discharge. -Continue trazodone 50 mg nightly as needed for sleep -Continue hydroxyzine 25 mg 3 times daily as needed for anxiety   Other PRNS -Continue Tylenol 650 mg every 6 hours PRN for mild pain -Continue Maalox 30 mg every 4 hrs PRN for indigestion -Continue Imodium 2-4 mg as needed for diarrhea -Continue Milk of Magnesia as needed every 6 hrs for constipation -Continue Zofran disintegrating tabs every 6 hrs PRN for nausea    Discharge Planning: Social work and case management to assist with discharge planning and identification of hospital follow-up needs prior to discharge Estimated LOS: 5-7 days Discharge Concerns: Need to establish a safety plan; Medication compliance and effectiveness Discharge Goals: Return home with outpatient referrals for mental health follow-up including medication management/psychotherapy   I certify that inpatient services furnished can reasonably be expected to improve the patient's condition.     Nicholes Rough, NP 05/24/2022, 5:23  PM

## 2022-05-24 NOTE — Group Note (Unsigned)
Date:  05/24/2022 Time:  10:52 AM  Group Topic/Focus:  Orientation:   The focus of this group is to educate the patient on the purpose and policies of crisis stabilization and provide a format to answer questions about their admission.  The group details unit policies and expectations of patients while admitted.     Participation Level:  {BHH PARTICIPATION HD:[id number]  Participation Quality:  {BHH PARTICIPATION QUALITY:22265}  Affect:  {BHH AFFECT:22266}  Cognitive:  {BHH COGNITIVE:22267}  Insight: {BHH Insight2:20797}  Engagement in Group:  {BHH ENGAGEMENT IN JY:[id number]  Modes of Intervention:  {BHH MODES OF INTERVENTION:22269}  Additional Comments:  ***  Garvin Fila 05/24/2022, 10:52 AM

## 2022-05-24 NOTE — BH IP Treatment Plan (Signed)
Interdisciplinary Treatment and Diagnostic Plan Update  05/24/2022 Time of Session: 9:40am Simmon Rieken MRN: [id number]  Principal Diagnosis: Undifferentiated schizophrenia [facility])  Secondary Diagnoses: Principal Problem:   Undifferentiated schizophrenia ([place]) Active Problems:   Insomnia   Delta-9-tetrahydrocannabinol (THC) dependence ([place])   Anxiety state   Current Medications:  Current Facility-Administered Medications  Medication Dose Route Frequency Provider Last Rate Last Admin   acetaminophen (TYLENOL) tablet 650 mg  650 mg Oral Q6H PRN Evette Georges, NP       alum & mag hydroxide-simeth (MAALOX/MYLANTA) 200-200-20 MG/5ML suspension 30 mL  30 mL Oral Q4H PRN Evette Georges, NP       alum & mag hydroxide-simeth (MAALOX/MYLANTA) 200-200-20 MG/5ML suspension 30 mL  30 mL Oral Q4H PRN Evette Georges, NP       hydrOXYzine (ATARAX) tablet 25 mg  25 mg Oral Q6H PRN Nicholes Rough, NP       OLANZapine zydis (ZYPREXA) disintegrating tablet 10 mg  10 mg Oral Q8H PRN Evette Georges, NP       And   LORazepam (ATIVAN) tablet 1 mg  1 mg Oral PRN Evette Georges, NP       And   ziprasidone (GEODON) injection 20 mg  20 mg Intramuscular PRN Evette Georges, NP       magnesium hydroxide (MILK OF MAGNESIA) suspension 30 mL  30 mL Oral Daily PRN Evette Georges, NP       magnesium hydroxide (MILK OF MAGNESIA) suspension 30 mL  30 mL Oral Daily PRN Evette Georges, NP       risperiDONE (RISPERDAL) tablet 2 mg  2 mg Oral BID Nicholes Rough, NP   2 mg at 05/24/22 0818   traZODone (DESYREL) tablet 50 mg  50 mg Oral QHS PRN Nicholes Rough, NP       PTA Medications: Medications Prior to Admission  Medication Sig Dispense Refill Last Dose   risperiDONE (RISPERDAL) 2 MG tablet Take 1 tablet (2 mg total) by mouth 2 (two) times daily. 60 tablet 2     Patient Stressors:    Patient Strengths:    Treatment Modalities: Medication Management, Group therapy, Case management,  1 to 1 session with clinician,  Psychoeducation, Recreational therapy.   Physician Treatment Plan for Primary Diagnosis: Undifferentiated schizophrenia ([place]) Long Term Goal(s): Improvement in symptoms so as ready for discharge   Short Term Goals: Ability to identify changes in lifestyle to reduce recurrence of condition will improve Ability to verbalize feelings will improve Ability to disclose and discuss suicidal ideas Ability to identify and develop effective coping behaviors will improve Ability to maintain clinical measurements within normal limits will improve Ability to identify triggers associated with substance abuse/mental health issues will improve  Medication Management: Evaluate patient's response, side effects, and tolerance of medication regimen.  Therapeutic Interventions: 1 to 1 sessions, Unit Group sessions and Medication administration.  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[place])   Anxiety state  Long Term Goal(s): Improvement in symptoms so as ready for discharge   Short Term Goals: Ability to identify changes in lifestyle to reduce recurrence of condition will improve Ability to verbalize feelings will improve Ability to disclose and discuss suicidal ideas Ability to identify and develop effective coping behaviors will improve Ability to maintain clinical measurements within normal limits will improve Ability to identify triggers associated with substance abuse/mental health issues will improve  Medication Management: Evaluate patient's response, side effects, and tolerance of medication regimen.  Therapeutic Interventions: 1 to 1 sessions, Unit Group sessions and Medication administration.  Evaluation of Outcomes: Progressing   RN Treatment Plan for Primary Diagnosis: Undifferentiated schizophrenia  ([place]) Long Term Goal(s): Knowledge of disease and therapeutic regimen to maintain health will improve  Short Term Goals: Ability to remain free from injury will improve, Ability to verbalize frustration and anger appropriately will improve, Ability to demonstrate self-control, Ability to participate in decision making will improve, Ability to verbalize feelings will improve, Ability to disclose and discuss suicidal ideas, Ability to identify and develop effective coping behaviors will improve, and Compliance with prescribed medications will improve  Medication Management: RN will administer medications as ordered by provider, will assess and evaluate patient's response and provide education to patient for prescribed medication. RN will report any adverse and/or side effects to prescribing provider.  Therapeutic Interventions: 1 on 1 counseling sessions, Psychoeducation, Medication administration, Evaluate responses to treatment, Monitor vital signs and CBGs as ordered, Perform/monitor CIWA, COWS, AIMS and Fall Risk screenings as ordered, Perform wound care treatments as ordered.  Evaluation of Outcomes: Progressing   LCSW Treatment Plan for Primary Diagnosis: Undifferentiated schizophrenia ([place]) Long Term Goal(s): Safe transition to appropriate next level of care at discharge, Engage patient in therapeutic group addressing interpersonal concerns.  Short Term Goals: Engage patient in aftercare planning with referrals and resources, Increase social support, Increase ability to appropriately verbalize feelings, Increase emotional regulation, Facilitate acceptance of mental health diagnosis and concerns, Facilitate patient progression through stages of change regarding substance use diagnoses and concerns, Identify triggers associated with mental health/substance abuse issues, and Increase skills for wellness and recovery  Therapeutic Interventions: Assess for all discharge needs, 1 to 1 time with  Social worker, Explore available resources and support systems, Assess for adequacy in community support network, Educate family and significant other(s) on suicide prevention, Complete Psychosocial Assessment, Interpersonal group therapy.  Evaluation of Outcomes: Progressing   Progress in Treatment: Attending groups: Yes. Participating in groups: Yes. Taking medication as prescribed: Yes. Toleration medication: Yes. Family/Significant other contact made: Yes, individual(s) contacted:  [facility] [phone] (Mother)  Patient understands diagnosis: Yes. Discussing patient identified problems/goals with staff: Yes. Medical problems stabilized or resolved: Yes. Denies suicidal/homicidal ideation: Yes. Issues/concerns per patient self-inventory: No.   New problem(s) identified: No, Describe:  none reported   New Short Term/Long Term Goal(s):   medication stabilization, elimination of SI thoughts, development of comprehensive mental wellness plan.    Patient Goals:  Patient states, "I want to be discharged today or tomorrow. I got stabilized on medications and I have not heard any voices"  Discharge Plan or Barriers: Patient recently admitted. CSW will continue to follow and assess for appropriate referrals and possible discharge planning.    Reason for Continuation of Hospitalization: Depression Hallucinations Medication stabilization  Estimated Length of Stay: 3-5 days  Last 3 [place] Suicide Severity Risk Score: [place] Admission (Current) from 05/22/2022 in [place] [facility] Most recent reading at 05/22/2022 11:00 PM ED from 05/22/2022 in [facility] Most recent reading at 05/22/2022 12:48 PM Office Visit from 01/24/2022 in [place] at [facility] Most recent reading at 01/24/2022  2:57 PM  C-SSRS RISK CATEGORY Moderate Risk Moderate Risk No Risk       Last PHQ 2/9  Scores:    07/13/2021   11:22 AM 06/09/2021   10:25 PM  Depression  screen PHQ 2/9  Decreased Interest 1 1  Down, Depressed, Hopeless 1 1  PHQ - 2 Score 2 2  Altered sleeping 2 2  Tired, decreased energy 1 1  Change in appetite 0 0  Feeling bad or failure about yourself  1 2  Trouble concentrating 1 1  Moving slowly or fidgety/restless 0 0  Suicidal thoughts 0 0  PHQ-9 Score 7 8  Difficult doing work/chores Very difficult Somewhat difficult    Scribe for Treatment Team: Zachery Conch, LCSW 05/24/2022 11:55 AM

## 2022-05-24 NOTE — Progress Notes (Signed)
Patient observed resting in bed this morning appearing a little anxious and focused on discharging. Patient denied SI,HI, and A/V/H with no plan or intent and believes he should not be here. Patient lacking insight of reason for admission and denies needing any help. Despite this, patient remains cooperative and is med compliant.

## 2022-05-24 NOTE — Group Note (Signed)
Date:  05/24/2022 Time:  10:33 AM  Group Topic/Focus:  Orientation:   The focus of this group is to educate the patient on the purpose and policies of crisis stabilization and provide a format to answer questions about their admission.  The group details unit policies and expectations of patients while admitted.    Participation Level:  Did Not Attend  Participation Quality:    Affect:    Cognitive:    Insight:   Engagement in Group:    Modes of Intervention:    Additional Comments:    Garvin Fila 05/24/2022, 10:33 AM

## 2022-05-24 NOTE — Group Note (Signed)
Date:  05/24/2022 Time:  5:00 PM  Group Topic/Focus:  Wellness Toolbox:   The focus of this group is to discuss various aspects of wellness, balancing those aspects and exploring ways to increase the ability to experience wellness.  Patients will create a wellness toolbox for use upon discharge.    Participation Level:  Active  Participation Quality:  Appropriate  Affect:  Appropriate  Cognitive:  Appropriate  Insight: Appropriate  Engagement in Group:  Engaged  Modes of Intervention:  Discussion  Additional Comments:  Pt participated in music therapy  Garvin Fila 05/24/2022, 5:00 PM

## 2022-05-25 LAB — MISC LABCORP TEST (SEND OUT)
Labcorp test code: 81950
Labcorp test code: 81950

## 2022-05-25 MED ORDER — BENZTROPINE MESYLATE 1 MG PO TABS
1.0000 mg | ORAL_TABLET | Freq: Two times a day (BID) | ORAL | Status: DC
  Administered 2022-05-25 – 2022-05-27 (×4): 1 mg via ORAL
  Filled 2022-05-25: qty 1
  Filled 2022-05-25: qty 14
  Filled 2022-05-25 (×6): qty 1
  Filled 2022-05-25: qty 14

## 2022-05-25 NOTE — Progress Notes (Signed)
   05/25/22 2200  Psych Admission Type (Psych Patients Only)  Admission Status Involuntary  Psychosocial Assessment  Patient Complaints None  Eye Contact Brief  Facial Expression Flat  Affect Flat  Speech Elective mutism  Interaction Minimal;Isolative  Motor Activity Slow  Appearance/Hygiene Unremarkable  Behavior Characteristics Guarded;Cooperative  Mood Labile  Thought Process  Coherency Circumstantial  Content Blaming others  Delusions None reported or observed  Perception Hallucinations  Hallucination Auditory  Judgment Poor  Confusion None  Danger to Self  Current suicidal ideation? Denies  Agreement Not to Harm Self Yes  Description of Agreement verbal  Danger to Others  Danger to Others None reported or observed  Danger to Others Abnormal  Harmful Behavior to others No threats or harm toward other people  Destructive Behavior No threats or harm toward property

## 2022-05-25 NOTE — Progress Notes (Signed)
   05/25/22 0100  Psych Admission Type (Psych Patients Only)  Admission Status Involuntary  Psychosocial Assessment  Patient Complaints Irritability  Eye Contact Fair;Brief  Facial Expression Flat  Affect Irritable;Flat  Speech Logical/coherent  Interaction Assertive  Motor Activity Slow  Appearance/Hygiene Unremarkable  Behavior Characteristics Irritable  Mood Anxious;Labile;Irritable  Thought Process  Coherency Circumstantial  Content Blaming others  Delusions None reported or observed  Perception Hallucinations  Hallucination Auditory  Judgment Poor  Confusion None  Danger to Self  Current suicidal ideation? Denies (denies)  Agreement Not to Harm Self Yes  Description of Agreement verbal  Danger to Others  Danger to Others None reported or observed  Danger to Others Abnormal  Harmful Behavior to others No threats or harm toward other people  Destructive Behavior No threats or harm toward property   Patient refused medications this shift Denies SI/HI/VH and verbally contracted for safety Support and encouragement provided.

## 2022-05-25 NOTE — [facility] Group Notes (Signed)
[place] Group Notes:  (Nursing/MHT/Case Management/Adjunct)  Date:  05/25/2022  Time:  2:45 PM  Type of Therapy:  Group Therapy  Participation Level:  Did Not Attend  Summary of Progress/Problems:  Patient did not attend [place] group today.  Elza Rafter 05/25/2022, 2:45 PM

## 2022-05-25 NOTE — Progress Notes (Signed)
Patient is med compliant and denies SI,HI, and A/V/H with no plan or intent. Patient appears guarded and remains mostly to self with minimal interaction. Patient remains cooperative on unit and states that he is ready to receive his injection so he can get out of here.

## 2022-05-25 NOTE — [facility] Group Notes (Signed)
Pt came to wrap-up group but refused to participate. Pt was distracting others.

## 2022-05-25 NOTE — Group Note (Signed)
LCSW Group Therapy Note   Group Date: 05/25/2022 Start Time: 1100 End Time: 1200  Type of Therapy and Topic:  Group Therapy - Healthy vs Unhealthy Coping Skills  Participation Level:  Did Not Attend   Description of Group The focus of this group was to determine what unhealthy coping techniques typically are used by group members and what healthy coping techniques would be helpful in coping with various problems. Patients were guided in becoming aware of the differences between healthy and unhealthy coping techniques. Patients were asked to identify 2-3 healthy coping skills they would like to learn to use more effectively.  Therapeutic Goals Patients learned that coping is what human beings do all day long to deal with various situations in their lives Patients defined and discussed healthy vs unhealthy coping techniques Patients identified their preferred coping techniques and identified whether these were healthy or unhealthy Patients determined 2-3 healthy coping skills they would like to become more familiar with and use more often. Patients provided support and ideas to each other   Summary of Patient Progress:  Did not attend    Therapeutic Modalities Cognitive [place], Latanya Presser 05/25/2022  1:04 PM

## 2022-05-25 NOTE — Group Note (Signed)
Date:  05/25/2022 Time:  12:12 PM  Group Topic/Focus:  Orientation:   The focus of this group is to educate the patient on the purpose and policies of crisis stabilization and provide a format to answer questions about their admission.  The group details unit policies and expectations of patients while admitted.    Participation Level:  Active  Participation Quality:  Appropriate  Affect:  Appropriate  Cognitive:  Appropriate  Insight: Appropriate  Engagement in Group:  Engaged  Modes of Intervention:  Discussion  Additional Comments:     Jerrye Beavers 05/25/2022, 12:12 PM

## 2022-05-25 NOTE — Plan of Care (Signed)
  Problem: Coping: Goal: Ability to verbalize frustrations and anger appropriately will improve Outcome: Progressing   Problem: Health Behavior/Discharge Planning: Goal: Compliance with treatment plan for underlying cause of condition will improve Outcome: Progressing   Problem: Safety: Goal: Periods of time without injury will increase Outcome: Progressing   

## 2022-05-25 NOTE — Progress Notes (Signed)
[facility] MD Progress Note  05/25/2022 3:43 PM Jeremy Cooper  MRN:  [id number] Principal Problem: Undifferentiated schizophrenia ([place]) Diagnosis: Principal Problem:   Undifferentiated schizophrenia ([place]) Active Problems:   Insomnia   Delta-9-tetrahydrocannabinol (THC) dependence ([place])   Anxiety state  Reason for admission: Jeremy Cooper is a 22 yo [place] American male with a history of THC dependence who was taken to the [facility] health [facility]) by his mother on 05/22/2022 for evaluation of auditory hallucinations in the context of medication non compliance. While at the [facility], pt also reported +SI & +HI, was guarded and would not disclose his plans.  Patient was deemed to be at risk of danger to self and others, and involuntarily committed prior to transfer by the [place] [employer] to this behavioral [facility] for treatment and stabilization of his mental status.   24 hr chart review:  The patient was seen in the chart reviewed and case discussed with nursing staff.  Staff reports that he has been sleeping better and has been denying all symptoms He refused the Haldol last night but took it earlier this morning.  He is willing to take the long-acting injectable and apparently is focused on discharge.  No behavioral issues noted in the last 24 hours however he remains irritable.  Patient assessment note 05/24/2022:  The patient is alert oriented and partially cooperative.  His affect is flat and he has some psychomotor retardation.  He maintained fair eye contact.  His speech is of low volume but rapid and periodically animated.  He escalates in his behavior when he starts talking on discharge.  He claims that he is not hearing voices at this time but seems to be minimizing his symptoms.  He has no explanation for why he did not take the Haldol last night but has taken it this morning.  He was on Risperdal before and claimed it was not effective.  Patient has  been noncompliant at home.  He denies current SI/HI/AVH. He is an appropriate candidate for long-acting injectable. No TD/EPS type symptoms found on assessment, and pt denies any feelings of stiffness. AIMS: 0.   Will begin Cogentin 1 mg twice daily and consider Haldol Decanoate on Saturday. Total Time spent with patient: 45 minutes  Past Psychiatric History: See H & P  Past Medical History: History reviewed. No pertinent past medical history. History reviewed. No pertinent surgical history. Family History: History reviewed. No pertinent family history. Family Psychiatric  History: See H & P Social History:  Social History   Substance and Sexual Activity  Alcohol Use Not Currently     Social History   Substance and Sexual Activity  Drug Use Not on file    Social History   Socioeconomic History   Marital status: Single    Spouse name: Not on file   Number of children: Not on file   Years of education: Not on file   Highest education level: Not on file  Occupational History   Not on file  Tobacco Use   Smoking status: Unknown   Smokeless tobacco: Not on file  Substance and Sexual Activity   Alcohol use: Not Currently   Drug use: Not on file   Sexual activity: Not on file  Other Topics Concern   Not on file  Social History Narrative   ** Merged History Encounter **       Social Determinants of Health   Financial Resource Strain: Not on file  Food Insecurity: No Food  Insecurity (05/22/2022)   Hunger Vital Sign    Worried About Running Out of Food in the Last Year: Never true    Ran Out of Food in the Last Year: Never true  Transportation Needs: No Transportation Needs (05/22/2022)   PRAPARE - Hydrologist (Medical): No    Lack of Transportation (Non-Medical): No  Physical Activity: Not on file  Stress: Not on file  Social Connections: Not on file   Sleep: Good  Appetite:  Good  Current Medications: Current Facility-Administered  Medications  Medication Dose Route Frequency Provider Last Rate Last Admin   acetaminophen (TYLENOL) tablet 650 mg  650 mg Oral Q6H PRN Evette Georges, NP       alum & mag hydroxide-simeth (MAALOX/MYLANTA) 200-200-20 MG/5ML suspension 30 mL  30 mL Oral Q4H PRN Evette Georges, NP       alum & mag hydroxide-simeth (MAALOX/MYLANTA) 200-200-20 MG/5ML suspension 30 mL  30 mL Oral Q4H PRN Evette Georges, NP       benztropine (COGENTIN) tablet 1 mg  1 mg Oral BID Ranae Palms, MD       haloperidol (HALDOL) tablet 5 mg  5 mg Oral BID Nicholes Rough, NP   5 mg at 05/25/22 0809   hydrOXYzine (ATARAX) tablet 25 mg  25 mg Oral Q6H PRN Nicholes Rough, NP       OLANZapine zydis (ZYPREXA) disintegrating tablet 10 mg  10 mg Oral Q8H PRN Evette Georges, NP       And   LORazepam (ATIVAN) tablet 1 mg  1 mg Oral PRN Evette Georges, NP       And   ziprasidone (GEODON) injection 20 mg  20 mg Intramuscular PRN Evette Georges, NP       magnesium hydroxide (MILK OF MAGNESIA) suspension 30 mL  30 mL Oral Daily PRN Evette Georges, NP       magnesium hydroxide (MILK OF MAGNESIA) suspension 30 mL  30 mL Oral Daily PRN Evette Georges, NP       traZODone (DESYREL) tablet 50 mg  50 mg Oral QHS PRN Nicholes Rough, NP        Lab Results:  Results for orders placed or performed during the hospital encounter of 05/22/22 (from the past 48 hour(s))  Urinalysis, Routine w reflex microscopic -Urine, Clean Catch     Status: Abnormal   Collection Time: 05/23/22  6:34 PM  Result Value Ref Range   Color, Urine STRAW (A) YELLOW   APPearance CLEAR CLEAR   Specific Gravity, Urine 1.010 1.005 - 1.030   pH 7.0 5.0 - 8.0   Glucose, UA NEGATIVE NEGATIVE mg/dL   Hgb urine dipstick NEGATIVE NEGATIVE   Bilirubin Urine NEGATIVE NEGATIVE   Ketones, ur NEGATIVE NEGATIVE mg/dL   Protein, ur NEGATIVE NEGATIVE mg/dL   Nitrite NEGATIVE NEGATIVE   Leukocytes,Ua NEGATIVE NEGATIVE   RBC / HPF 0-5 0 - 5 RBC/hpf   WBC, UA 0-5 0 - 5 WBC/hpf    Bacteria, UA NONE SEEN NONE SEEN   Squamous Epithelial / HPF 0-5 0 - 5 /HPF   Mucus PRESENT     Comment: Performed at [facility], [place] [address] [place], [place] [address]  Miscellaneous [facility] test (send-out)     Status: None   Collection Time: 05/24/22  6:46 AM  Result Value Ref Range   [facility] test code [id number]    [facility] test name VD25    Source ([facility]) 1ML SERUM  Comment: Performed at [facility], [place] [address] [place], [place] [address]   Misc [facility] result COMMENT     Comment: (NOTE) Test Ordered: [phone] Vitamin D, 25-Hydroxy Vitamin D, 25-Hydroxy          4.7         [L ] ng/mL    BN     Reference Range: 30.0-100.0                            Vitamin D deficiency has been defined by the [place] practice guideline as a level of serum 25-OH vitamin D less than 20 ng/mL (1,2). The Endocrine Society went on to further define vitamin D insufficiency as a level between 21 and 29 ng/mL (2). 1. IOM (Institute of Medicine). 2010. Dietary reference   intakes for calcium and D. [place]: The   [employer]. 2. Holick MF, [place], Bischoff-Ferrari HA, et al.   Evaluation, treatment, and prevention of vitamin D   deficiency: an Endocrine Society clinical practice   guideline. JCEM. 2011 Jul; 96(7):1911-30. Performed At: [facility] [place], [place] [id number] Rush Farmer MD [id number]   Vitamin B12     Status: None   Collection Time: 05/24/22  6:47 AM  Result Value Ref Range   Vitamin B-12 325 180 - 914 pg/mL    Comment: (NOTE) This assay is not validated for testing neonatal or myeloproliferative syndrome specimens for Vitamin B12 levels. Performed at [facility], [place] [address], [place], [place] [address]     Blood Alcohol level:  Lab Results  Component Value Date   ETH <10 07/28/2021   ETH <10 [id number]    Metabolic Disorder  Labs: Lab Results  Component Value Date   HGBA1C 4.9 05/22/2022   MPG 93.93 05/22/2022   MPG 93.93 07/28/2021   No results found for: "PROLACTIN" Lab Results  Component Value Date   CHOL 164 05/22/2022   TRIG 64 05/22/2022   HDL 37 (L) 05/22/2022   CHOLHDL 4.4 05/22/2022   VLDL 13 05/22/2022   LDLCALC 114 (H) 05/22/2022    Physical Findings: AIMS:  , ,  ,  ,    CIWA:    COWS:     Musculoskeletal: Strength & Muscle Tone: within normal limits Gait & Station: normal Patient leans: N/A  Psychiatric Specialty Exam:  Presentation  General Appearance:  Appropriate for Environment  Eye Contact: Fair  Speech: Clear and Coherent  Speech Volume: Decreased  Handedness: Right   Mood and Affect  Mood: Anxious; Irritable  Affect: Constricted   Thought Process  Thought Processes: Linear  Descriptions of Associations:Tangential  Orientation:Full (Time, Place and Person)  Thought Content:Rumination; Perseveration  History of Schizophrenia/Schizoaffective disorder:No  Duration of Psychotic Symptoms:Greater than six months  Hallucinations:Hallucinations: Auditory Description of Auditory Hallucinations: Was noted to have chronic hallucinations and carries on conversations.  Ideas of Reference:Paranoia; Percusatory  Suicidal Thoughts:Suicidal Thoughts: No  Homicidal Thoughts:Homicidal Thoughts: No   Sensorium  Memory: Immediate Fair; Remote Fair; Recent Fair  Judgment: Poor  Insight: Poor   Executive Functions  Concentration: Fair  Attention Span: Fair  Recall: [place] of Knowledge: Fair  Language: Fair   Psychomotor Activity  Psychomotor Activity: Psychomotor Activity: Normal   Assets  Assets: Desire for Improvement; Social Support   Sleep  Sleep: Sleep: Fair    Physical Exam: Physical Exam Constitutional:  Appearance: Normal appearance.  HENT:     Head: Normocephalic.     Nose: Nose normal.   Pulmonary:     Effort: Pulmonary effort is normal.  Musculoskeletal:        General: Normal range of motion.  Neurological:     Mental Status: He is alert.    Review of Systems  Constitutional:  Negative for fever.  HENT: Negative.    Eyes: Negative.   Respiratory: Negative.    Cardiovascular: Negative.   Gastrointestinal: Negative.   Genitourinary: Negative.   Musculoskeletal: Negative.   Skin: Negative.   Neurological: Negative.  Negative for dizziness.  Psychiatric/Behavioral:  Positive for depression and substance abuse. Negative for hallucinations, memory loss and suicidal ideas. The patient is nervous/anxious and has insomnia.    Blood pressure 113/70, pulse 91, temperature 98.4 F (36.9 C), temperature source Oral, resp. rate 18, height 5' 4"$  (1.626 m), weight 56.2 kg, SpO2 100 %. Body mass index is 21.28 kg/m.   Treatment Plan Summary: Treatment Plan Summary: Daily contact with patient to assess and evaluate symptoms and progress in treatment and Medication management   Observation Level/Precautions:  15 minute checks  Laboratory:  Labs reviewed   Psychotherapy:  Unit Group sessions  Medications:  See [facility]  Consultations:  To be determined   Discharge Concerns:  Safety, medication compliance, mood stability  Estimated LOS: 5-7 days  Other:  N/A    Labs reviewed on 05/23/2022: CMP WNL.  CBC WNL.  Respiratory panel negative.  UDS positive for THC.  Lipid panel with HDL slightly low at 37 and LDL slightly elevated at 114.  Educated on healthy food choices and exercise.  TSH WNL.  Hemoglobin A1c WNL.   Labs ordered: Vitamins B1, D, B12.  Baseline UA ordered.  Baseline EKG ordered.   PLAN Safety and Monitoring: Voluntary admission to inpatient psychiatric unit for safety, stabilization and treatment Daily contact with patient to assess and evaluate symptoms and progress in treatment Patient's case to be discussed in multi-disciplinary team meeting Observation Level :  q15 minute checks Vital signs: q12 hours Precautions: Safety   Long Term Goal(s): Improvement in symptoms so as ready for discharge   Short Term Goals: Ability to identify changes in lifestyle to reduce recurrence of condition will improve, Ability to verbalize feelings will improve, Ability to disclose and discuss suicidal ideas, Ability to identify and develop effective coping behaviors will improve, Ability to maintain clinical measurements within normal limits will improve, and Ability to identify triggers associated with substance abuse/mental health issues will improve   Diagnoses Principal Problem:   Undifferentiated schizophrenia ([place]) Active Problems:   Insomnia   Delta-9-tetrahydrocannabinol (THC) dependence ([facility])   Anxiety state   Medications -Discontinued Risperdal 2 mg BID d/t ineffectiveness-has taken med fir >1 yr and it stopped working as per mother -Continue Haldol 5 mg BID for psychosis/mood stabilization-Plan is to give LAI prior to discharge. -Begin Cogentin 1 mg p.o. twice daily -Continue trazodone 50 mg nightly as needed for sleep -Continue hydroxyzine 25 mg 3 times daily as needed for anxiety   Other PRNS -Continue Tylenol 650 mg every 6 hours PRN for mild pain -Continue Maalox 30 mg every 4 hrs PRN for indigestion -Continue Imodium 2-4 mg as needed for diarrhea -Continue Milk of Magnesia as needed every 6 hrs for constipation -Continue Zofran disintegrating tabs every 6 hrs PRN for nausea    Discharge Planning: Social work and case management to assist with discharge planning and identification of hospital  follow-up needs prior to discharge Estimated LOS: 5-7 days Discharge Concerns: Need to establish a safety plan; Medication compliance and effectiveness Discharge Goals: Return home with outpatient referrals for mental health follow-up including medication management/psychotherapy   I certify that inpatient services furnished can reasonably be expected to  improve the patient's condition.     Ranae Palms, MD 05/25/2022, 3:43 PMPatient ID: Jeremy Cooper, male   DOB: Dec 07, 2000, 22 y.o.   MRN: [id number] Total Time Spent in Direct Patient Care:  I personally spent 30 minutes on the unit in direct patient care. The direct patient care time included face-to-face time with the patient, reviewing the patient's chart, communicating with other professionals, and coordinating care. Greater than 50% of this time was spent in counseling or coordinating care with the patient regarding goals of hospitalization, psycho-education, and discharge planning needs.   [place] Psychiatrist

## 2022-05-26 DIAGNOSIS — F203 Undifferentiated schizophrenia: Principal | ICD-10-CM

## 2022-05-26 MED ORDER — NICOTINE POLACRILEX 2 MG MT GUM
2.0000 mg | CHEWING_GUM | OROMUCOSAL | Status: DC | PRN
  Administered 2022-05-26: 2 mg via ORAL
  Filled 2022-05-26: qty 1

## 2022-05-26 MED ORDER — HALOPERIDOL 5 MG PO TABS
5.0000 mg | ORAL_TABLET | Freq: Every day | ORAL | Status: DC
  Administered 2022-05-26: 5 mg via ORAL
  Filled 2022-05-26: qty 1
  Filled 2022-05-26 (×2): qty 7
  Filled 2022-05-26: qty 1

## 2022-05-26 MED ORDER — HALOPERIDOL DECANOATE 100 MG/ML IM SOLN
50.0000 mg | INTRAMUSCULAR | Status: DC
  Administered 2022-05-26: 50 mg via INTRAMUSCULAR
  Filled 2022-05-26: qty 1
  Filled 2022-05-26: qty 0.5

## 2022-05-26 NOTE — Progress Notes (Signed)
[facility] MD Progress Note  05/26/2022 2:49 PM Jeremy Cooper  MRN:  [id number] Principal Problem: Undifferentiated schizophrenia ([place]) Diagnosis: Principal Problem:   Undifferentiated schizophrenia ([place]) Active Problems:   Insomnia   Delta-9-tetrahydrocannabinol (THC) dependence ([place])   Anxiety state  Reason for admission: Jeremy Cooper is a 22 yo [place] American male with a history of THC dependence who was taken to the [facility]) by his mother on 05/22/2022 for evaluation of auditory hallucinations in the context of medication non compliance. While at the [facility], pt also reported +SI & +HI, was guarded and would not disclose his plans.  Patient was deemed to be at risk of danger to self and others, and involuntarily committed prior to transfer by the [facility] [employer] to this behavioral [facility] for treatment and stabilization of his mental status.   24 hr chart review:  The patient was seen in the chart reviewed and case discussed with nursing staff.  Staff reports that he is focused on discharge and has been compliant with medications since yesterday with no side effects.  He slept fair.  He denies psychosis.  He is willing to take a long-acting injectable.  Patient assessment note 05/26/2022:  The patient is alert oriented and talkative.  This morning he received Haldol decanoate 50 mg IM with no side effects.  He is still focused on discharge and would like to be discharged tomorrow because he is plans to go back to work at [employer].  He is contracting for safety. On mental status examination the patient is alert, oriented x 3 and cooperative.  His speech is without any obvious looseness of associations or flight of ideas.  He denies depression denies paranoia and denies current psychotic symptoms.  He denies any SI/HI/AVH.  He does endorse the fact that at home at night's he usually tends to hear voices.  He also admits that he smokes at  least 1-2 blunts of marijuana a day.  In the past he was on Risperdal and claims that it was not effective which was most likely secondary to his noncompliance. No TD/EPS type symptoms found on assessment, and pt denies any feelings of stiffness. AIMS: 0.   Total Time spent with patient: 45 minutes  Past Psychiatric History: See H & P  Past Medical History: History reviewed. No pertinent past medical history. History reviewed. No pertinent surgical history. Family History: History reviewed. No pertinent family history. Family Psychiatric  History: See H & P Social History:  Social History   Substance and Sexual Activity  Alcohol Use Not Currently     Social History   Substance and Sexual Activity  Drug Use Not on file    Social History   Socioeconomic History   Marital status: Single    Spouse name: Not on file   Number of children: Not on file   Years of education: Not on file   Highest education level: Not on file  Occupational History   Not on file  Tobacco Use   Smoking status: Unknown   Smokeless tobacco: Not on file  Substance and Sexual Activity   Alcohol use: Not Currently   Drug use: Not on file   Sexual activity: Not on file  Other Topics Concern   Not on file  Social History Narrative   ** Merged History Encounter **       Social Determinants of Health   Financial Resource Strain: Not on file  Food Insecurity: No Food Insecurity (05/22/2022)  Hunger Vital Sign    Worried About Running Out of Food in the Last Year: Never true    Ran Out of Food in the Last Year: Never true  Transportation Needs: No Transportation Needs (05/22/2022)   PRAPARE - Hydrologist (Medical): No    Lack of Transportation (Non-Medical): No  Physical Activity: Not on file  Stress: Not on file  Social Connections: Not on file   Sleep: Good  Appetite:  Good  Current Medications: Current Facility-Administered Medications  Medication Dose Route  Frequency Provider Last Rate Last Admin   acetaminophen (TYLENOL) tablet 650 mg  650 mg Oral Q6H PRN Evette Georges, NP       alum & mag hydroxide-simeth (MAALOX/MYLANTA) 200-200-20 MG/5ML suspension 30 mL  30 mL Oral Q4H PRN Evette Georges, NP       alum & mag hydroxide-simeth (MAALOX/MYLANTA) 200-200-20 MG/5ML suspension 30 mL  30 mL Oral Q4H PRN Evette Georges, NP       benztropine (COGENTIN) tablet 1 mg  1 mg Oral BID Ranae Palms, MD   1 mg at 05/26/22 [id number]   haloperidol (HALDOL) tablet 5 mg  5 mg Oral QHS Ranae Palms, MD       haloperidol decanoate (HALDOL DECANOATE) 100 MG/ML injection 50 mg  50 mg Intramuscular Q30 days Ranae Palms, MD   50 mg at 05/26/22 1149   hydrOXYzine (ATARAX) tablet 25 mg  25 mg Oral Q6H PRN Nicholes Rough, NP       OLANZapine zydis (ZYPREXA) disintegrating tablet 10 mg  10 mg Oral Q8H PRN Evette Georges, NP       And   LORazepam (ATIVAN) tablet 1 mg  1 mg Oral PRN Evette Georges, NP       And   ziprasidone (GEODON) injection 20 mg  20 mg Intramuscular PRN Evette Georges, NP       magnesium hydroxide (MILK OF MAGNESIA) suspension 30 mL  30 mL Oral Daily PRN Evette Georges, NP       magnesium hydroxide (MILK OF MAGNESIA) suspension 30 mL  30 mL Oral Daily PRN Evette Georges, NP       traZODone (DESYREL) tablet 50 mg  50 mg Oral QHS PRN Nicholes Rough, NP        Lab Results:  No results found for this or any previous visit (from the past 48 hour(s)).   Blood Alcohol level:  Lab Results  Component Value Date   ETH <10 07/28/2021   ETH <10 [id number]    Metabolic Disorder Labs: Lab Results  Component Value Date   HGBA1C 4.9 05/22/2022   MPG 93.93 05/22/2022   MPG 93.93 07/28/2021   No results found for: "PROLACTIN" Lab Results  Component Value Date   CHOL 164 05/22/2022   TRIG 64 05/22/2022   HDL 37 (L) 05/22/2022   CHOLHDL 4.4 05/22/2022   VLDL 13 05/22/2022   LDLCALC 114 (H) 05/22/2022    Physical Findings: AIMS:  , ,  ,  ,    CIWA:     COWS:     Musculoskeletal: Strength & Muscle Tone: within normal limits Gait & Station: normal Patient leans: N/A  Psychiatric Specialty Exam:  Presentation  General Appearance:  Appropriate for Environment; Casual  Eye Contact: Good  Speech: Clear and Coherent  Speech Volume: Decreased  Handedness: Right   Mood and Affect  Mood: Anxious  Affect: Constricted; Flat   Thought Process  Thought Processes: Linear  Descriptions of Associations:Intact  Orientation:Full (Time, Place and Person)  Thought Content:Rumination  History of Schizophrenia/Schizoaffective disorder:Yes  Duration of Psychotic Symptoms:Greater than six months  Hallucinations:Hallucinations: Auditory Description of Auditory Hallucinations: Hears voices specially at night  Ideas of Reference:Delusions  Suicidal Thoughts:Suicidal Thoughts: No  Homicidal Thoughts:Homicidal Thoughts: No   Sensorium  Memory: Immediate Fair; Remote Fair; Recent Fair  Judgment: Fair  Insight: Poor   Executive Functions  Concentration: Fair  Attention Span: Fair  Recall: [employer] of Knowledge: Fair  Language: Fair   Psychomotor Activity  Psychomotor Activity: Psychomotor Activity: Normal   Assets  Assets: Armed forces logistics/support/administrative officer; Housing; Transportation   Sleep  Sleep: Sleep: Fair    Physical Exam: Physical Exam Constitutional:      Appearance: Normal appearance.  HENT:     Head: Normocephalic.     Nose: Nose normal.  Pulmonary:     Effort: Pulmonary effort is normal.  Musculoskeletal:        General: Normal range of motion.  Neurological:     Mental Status: He is alert.    Review of Systems  Constitutional:  Negative for fever.  HENT: Negative.    Eyes: Negative.   Respiratory: Negative.    Cardiovascular: Negative.   Gastrointestinal: Negative.   Genitourinary: Negative.   Musculoskeletal: Negative.   Skin: Negative.   Neurological: Negative.   Negative for dizziness.  Psychiatric/Behavioral:  Positive for depression and substance abuse. Negative for hallucinations, memory loss and suicidal ideas. The patient is nervous/anxious and has insomnia.    Blood pressure (!) 114/50, pulse 98, temperature 98.4 F (36.9 C), temperature source Oral, resp. rate 16, height 5' 4"$  (1.626 m), weight 56.2 kg, SpO2 97 %. Body mass index is 21.28 kg/m.   Treatment Plan Summary: Treatment Plan Summary: Daily contact with patient to assess and evaluate symptoms and progress in treatment and Medication management   Observation Level/Precautions:  15 minute checks  Laboratory:  Labs reviewed   Psychotherapy:  Unit Group sessions  Medications:  See [facility]  Consultations:  To be determined   Discharge Concerns:  Safety, medication compliance, mood stability  Estimated LOS: 5-7 days  Other:  N/A    Labs reviewed on 05/23/2022: CMP WNL.  CBC WNL.  Respiratory panel negative.  UDS positive for THC.  Lipid panel with HDL slightly low at 37 and LDL slightly elevated at 114.  Educated on healthy food choices and exercise.  TSH WNL.  Hemoglobin A1c WNL.   Labs ordered: Vitamins B1, D, B12.  Baseline UA ordered.  Baseline EKG ordered.   PLAN Safety and Monitoring: Voluntary admission to inpatient psychiatric unit for safety, stabilization and treatment Daily contact with patient to assess and evaluate symptoms and progress in treatment Patient's case to be discussed in multi-disciplinary team meeting Observation Level : q15 minute checks Vital signs: q12 hours Precautions: Safety   Long Term Goal(s): Improvement in symptoms so as ready for discharge   Short Term Goals: Ability to identify changes in lifestyle to reduce recurrence of condition will improve, Ability to verbalize feelings will improve, Ability to disclose and discuss suicidal ideas, Ability to identify and develop effective coping behaviors will improve, Ability to maintain clinical  measurements within normal limits will improve, and Ability to identify triggers associated with substance abuse/mental health issues will improve   Diagnoses Principal Problem:   Undifferentiated schizophrenia ([place]) Active Problems:   Insomnia   Delta-9-tetrahydrocannabinol (THC) dependence ([facility])   Anxiety state   Medications -Discontinued Risperdal 2 mg BID d/t ineffectiveness-has taken  med fir >1 yr and it stopped working as per mother -Haldol decanoate 50 mg IM every 30 days with the first dose given today -Continue Haldol 5 mg at bedtime -Begin Cogentin 1 mg p.o. twice daily -Continue trazodone 50 mg nightly as needed for sleep -Continue hydroxyzine 25 mg 3 times daily as needed for anxiety   Other PRNS -Continue Tylenol 650 mg every 6 hours PRN for mild pain -Continue Maalox 30 mg every 4 hrs PRN for indigestion -Continue Imodium 2-4 mg as needed for diarrhea -Continue Milk of Magnesia as needed every 6 hrs for constipation -Continue Zofran disintegrating tabs every 6 hrs PRN for nausea    Discharge Planning: Social work and case management to assist with discharge planning and identification of hospital follow-up needs prior to discharge Estimated LOS: Possible discharge on Saturday. Discharge Concerns: Need to establish a safety plan; Medication compliance and effectiveness Discharge Goals: Return home with outpatient referrals for mental health follow-up including medication management/psychotherapy   I certify that inpatient services furnished can reasonably be expected to improve the patient's condition.     Ranae Palms, MD 05/26/2022, 2:49 PMPatient ID: Jeremy Cooper, male   DOB: 2000/11/18, 22 y.o.   MRN: [id number] Total Time Spent in Direct Patient Care:  I personally spent 30 minutes on the unit in direct patient care. The direct patient care time included face-to-face time with the patient, reviewing the patient's chart, communicating with other professionals,  and coordinating care. Greater than 50% of this time was spent in counseling or coordinating care with the patient regarding goals of hospitalization, psycho-education, and discharge planning needs.   [place] Psychiatrist Patient ID: Jeremy Cooper, male   DOB: October 20, 2000, 22 y.o.   MRN: [id number]

## 2022-05-26 NOTE — Group Note (Signed)
Recreation Therapy Group Note   Group Topic:Problem Solving  Group Date: 05/26/2022 Start Time: 0930 End Time: 1000 Facilitators: Staley Lunz-McCall, LRT,CTRS Location: [address] Dayroom   Goal Area(s) Addresses:  Patient will effectively work with peer towards shared goal.  Patient will identify skills used to make activity successful.  Patient will share challenges and verbalize solution-driven approaches used. Patient will identify how skills used during activity can be used to reach post d/c goals.    Group Description:  [employer]. Patients were provided the following materials: 4 drinking straws, 5 rubber bands, 5 paper clips, 2 index cards, 2 drinking cups, and 2 toilet paper rolls. Using the provided materials patients were asked to build a launching mechanism to launch a ping pong ball across the room, approximately 10 feet. Patients were divided into teams of 3-5. Instructions required all materials be incorporated into the device, functionality of items left to the peer group's discretion.   Affect/Mood: Appropriate   Participation Level: Engaged   Participation Quality: Independent   Behavior: Appropriate   Speech/Thought Process: Focused   Insight: Good   Judgement: Good   Modes of Intervention: Problem-solving   Patient Response to Interventions:  Engaged   Education Outcome:  Acknowledges education and In group clarification offered    Clinical Observations/Individualized Feedback: Pt attended and participated in group activity.     Plan: Continue to engage patient in RT group sessions 2-3x/week.   Riyan Haile-McCall, LRT,CTRS 05/26/2022 12:52 PM

## 2022-05-26 NOTE — Progress Notes (Signed)
   05/26/22 0800  Psych Admission Type (Psych Patients Only)  Admission Status Involuntary  Psychosocial Assessment  Patient Complaints None  Eye Contact Brief  Facial Expression Flat  Affect Flat  Speech Soft  Interaction Minimal;Isolative  Motor Activity Slow  Appearance/Hygiene Unremarkable  Behavior Characteristics Guarded;Cooperative;Irritable  Mood Labile  Aggressive Behavior  Effect No apparent injury  Thought Process  Coherency Circumstantial  Content Blaming others  Delusions None reported or observed  Perception Hallucinations  Hallucination Auditory  Judgment Poor  Confusion None  Danger to Self  Current suicidal ideation? Denies  Agreement Not to Harm Self Yes  Description of Agreement Verbal  Danger to Others  Danger to Others None reported or observed  Danger to Others Abnormal  Harmful Behavior to others No threats or harm toward other people  Destructive Behavior No threats or harm toward property

## 2022-05-26 NOTE — Group Note (Unsigned)
Date:  05/26/2022 Time:  11:38 AM  Group Topic/Focus:  Orientation:   The focus of this group is to educate the patient on the purpose and policies of crisis stabilization and provide a format to answer questions about their admission.  The group details unit policies and expectations of patients while admitted.     Participation Level:  {BHH PARTICIPATION HD:996081  Participation Quality:  {BHH PARTICIPATION QUALITY:22265}  Affect:  {BHH AFFECT:22266}  Cognitive:  {BHH COGNITIVE:22267}  Insight: {BHH Insight2:20797}  Engagement in Group:  {BHH ENGAGEMENT IN JY:[id number]  Modes of Intervention:  {BHH MODES OF INTERVENTION:22269}  Additional Comments:  ***  Garvin Fila 05/26/2022, 11:38 AM

## 2022-05-26 NOTE — Progress Notes (Signed)
   05/26/22 0534  15 Minute Checks  Location Bedroom  Visual Appearance Calm  Behavior Sleeping  Sleep (Behavioral Health Patients Only)  Calculate sleep? (Click Yes once per 24 hr at 0600 safety check) Yes  Documented sleep last 24 hours 9.5

## 2022-05-26 NOTE — Plan of Care (Signed)
  Problem: Education: Goal: Ability to state activities that reduce stress will improve Outcome: Progressing   Problem: Self-Concept: Goal: Ability to identify factors that promote anxiety will improve Outcome: Progressing Goal: Level of anxiety will decrease Outcome: Progressing Goal: Ability to modify response to factors that promote anxiety will improve Outcome: Progressing

## 2022-05-27 DIAGNOSIS — F203 Undifferentiated schizophrenia: Principal | ICD-10-CM

## 2022-05-27 MED ORDER — BENZTROPINE MESYLATE 1 MG PO TABS: 1.0000 mg | ORAL_TABLET | Freq: Two times a day (BID) | ORAL | 0 refills | Status: AC

## 2022-05-27 MED ORDER — HALOPERIDOL DECANOATE 100 MG/ML IM SOLN: 50.0000 mg | INTRAMUSCULAR | 5 refills | Status: AC

## 2022-05-27 MED ORDER — NICOTINE POLACRILEX 2 MG MT GUM: 2.0000 mg | CHEWING_GUM | OROMUCOSAL | 0 refills | Status: AC | PRN

## 2022-05-27 MED ORDER — HALOPERIDOL 5 MG PO TABS: 5.0000 mg | ORAL_TABLET | Freq: Every day | ORAL | 0 refills | Status: AC

## 2022-05-27 NOTE — Progress Notes (Signed)
Patient observed in day area this morning. Patient denies SI,HI, and A/V/H with no plan or intent. Patient is med compliant and remains cooperative on unit.

## 2022-05-27 NOTE — Progress Notes (Signed)
  [facility] Adult Case Management Discharge Plan :  Will you be returning to the same living situation after discharge:  Yes,  with parent At discharge, do you have transportation home?: Yes,  mother Do you have the ability to pay for your medications: No.  Referred to community agency  Release of information consent forms completed and emailed to Medical Records, then turned in to Medical Records by CSW.   Patient to Follow up at:  Follow-up Information     [facility] [place]. Go on 06/01/2022.   Why: You have a hospital follow up appointment for therapy and medication management services on 06/01/22 at 12:30 pm.  This appointment will be held in person. Contact information: [address] [place] [address] [phone]                 Next level of care provider has access to [place] and Suicide Prevention discussed: Yes,  with mother     Has patient been referred to the Quitline?: N/A patient is not a smoker  Patient has been referred for addiction treatment: Yes  Maretta Los, LCSW 05/27/2022, 9:36 AM

## 2022-05-27 NOTE — BHH Suicide Risk Assessment (Signed)
[facility] Discharge Suicide Risk Assessment   Principal Problem: Undifferentiated schizophrenia ([place]) Discharge Diagnoses: Principal Problem:   Undifferentiated schizophrenia ([place]) Active Problems:   Insomnia   Delta-9-tetrahydrocannabinol (THC) dependence ([place])   Anxiety state   Total Time spent with patient: 30 minutes  Musculoskeletal: Strength & Muscle Tone: within normal limits Gait & Station: normal Patient leans: N/A  Psychiatric Specialty Exam  Presentation  General Appearance:  Appropriate for Environment; Casual  Eye Contact: Good  Speech: Clear and Coherent  Speech Volume: Decreased  Handedness: Right   Mood and Affect  Mood: Anxious  Duration of Depression Symptoms: Greater than two weeks  Affect: Constricted; Flat   Thought Process  Thought Processes: Linear  Descriptions of Associations:Intact  Orientation:Full (Time, Place and Person)  Thought Content:Rumination  History of Schizophrenia/Schizoaffective disorder:Yes  Duration of Psychotic Symptoms:Greater than six months  Hallucinations:Hallucinations: Auditory Description of Auditory Hallucinations: Hears voices specially at night  Ideas of Reference:Delusions  Suicidal Thoughts:Suicidal Thoughts: No  Homicidal Thoughts:Homicidal Thoughts: No   Sensorium  Memory: Immediate Fair; Remote Fair; Recent Fair  Judgment: Fair  Insight: Poor   Executive Functions  Concentration: Fair  Attention Span: Fair  Recall: [employer] of Knowledge: Fair  Language: Fair   Psychomotor Activity  Psychomotor Activity: Psychomotor Activity: Normal   Assets  Assets: Armed forces logistics/support/administrative officer; Housing; Transportation   Sleep  Sleep: Sleep: Fair   Physical Exam: Physical Exam ROS Blood pressure 111/68, pulse 77, temperature 98.2 F (36.8 C), temperature source Oral, resp. rate 16, height 5' 4"$  (1.626 m), weight 56.2 kg, SpO2 99 %. Body mass index is 21.28  kg/m.  Mental Status Per Nursing Assessment::   On Admission:  Suicidal ideation indicated by patient, Thoughts of violence towards others  Demographic Factors:  Gay, lesbian, or bisexual orientation  Loss Factors: NA  Historical Factors: Impulsivity  Risk Reduction Factors:   Employed and Positive social support  Continued Clinical Symptoms:  Schizophrenia:   Paranoid or undifferentiated type  Cognitive Features That Contribute To Risk:  None    Suicide Risk:  Mild:  Suicidal ideation of limited frequency, intensity, duration, and specificity.  There are no identifiable plans, no associated intent, mild dysphoria and related symptoms, good self-control (both objective and subjective assessment), few other risk factors, and identifiable protective factors, including available and accessible social support.   Follow-up Information     [facility], [place]. Go on 06/01/2022.   Why: You have a hospital follow up appointment for therapy and medication management services on 06/01/22 at 12:30 pm.  This appointment will be held in person. Contact information: [address] [place] [address] [phone]                 Plan Of Care/Follow-up recommendations:  Activity:  as tolerated  Ranae Palms, MD 05/27/2022, 8:52 AM

## 2022-05-27 NOTE — Group Note (Signed)
LCSW Group Therapy Note  Group Date: 05/27/2022 Start Time: [id number] End Time: 1115   Type of Therapy and Topic:  Group Therapy: Anger Cues and Responses  Participation Level:  Active   Description of Group:   In this group, patients learned how to recognize the physical, cognitive, emotional, and behavioral responses they have to anger-provoking situations.  They identified a recent time they became angry and how they reacted.  They analyzed how their reaction was possibly beneficial and how it was possibly unhelpful.  The group discussed a variety of healthier coping skills that could help with such a situation in the future.  Focus was placed on how helpful it is to recognize the underlying emotions to our anger, because working on those can lead to a more permanent solution as well as our ability to focus on the important rather than the urgent.  Therapeutic Goals: Patients will remember their last incident of anger and how they felt emotionally and physically, what their thoughts were at the time, and how they behaved. Patients will identify how their behavior at that time worked for them, as well as how it worked against them. Patients will explore possible new behaviors to use in future anger situations. Patients will learn that anger itself is normal and cannot be eliminated, and that healthier reactions can assist with resolving conflict rather than worsening situations.  Summary of Patient Progress:  Jeremy Cooper was active during the group. He shared a recent occurrence wherein somebody telling him to do something he was already doing led to anger. He reacted by turning his music up louder as a coping mechanism. He demonstrated faor insight into the subject matter, was respectful of peers, and participated throughout the entire session.  Therapeutic Modalities:   Cognitive Behavioral Therapy    Marquette Old 05/27/2022  4:08 PM

## 2022-05-27 NOTE — Discharge Summary (Signed)
Physician Discharge Summary Note  Patient:  Jeremy Cooper is an 22 y.o., male MRN:  [id number] DOB:  08-12-2000 Patient phone:  [phone] (home)  Patient address:   [address] [place] [address],  Total Time spent with patient: 45 minutes  Date of Admission:  05/22/2022 Date of Discharge: 05/27/2022  Reason for Admission:  Jeremy Cooper is a 22 yo [place] American male with a history of THC dependence who was taken to the [facility] health center [facility]) by his mother on 05/22/2022 for evaluation of auditory hallucinations in the context of medication non compliance. While at the [facility], pt also reported +SI & +HI, was guarded and would not disclose his plans.  Patient was deemed to be at risk of danger to self and others, and involuntarily committed prior to transfer by the [facility] to this behavioral [facility] for treatment and stabilization of his mental status.     Principal Problem: Undifferentiated schizophrenia [facility]) Discharge Diagnoses: Principal Problem:   Undifferentiated schizophrenia ([place]) Active Problems:   Insomnia   Delta-9-tetrahydrocannabinol (THC) dependence ([place])   Anxiety state  Past Psychiatric History: See H & P  Past Medical History: History reviewed. No pertinent past medical history. History reviewed. No pertinent surgical history. Family History: History reviewed. No pertinent family history. Family Psychiatric  History: See H & P Social History:  Social History   Substance and Sexual Activity  Alcohol Use Not Currently     Social History   Substance and Sexual Activity  Drug Use Not on file    Social History   Socioeconomic History   Marital status: Single    Spouse name: Not on file   Number of children: Not on file   Years of education: Not on file   Highest education level: Not on file  Occupational History   Not on file  Tobacco Use   Smoking status: Unknown   Smokeless  tobacco: Not on file  Substance and Sexual Activity   Alcohol use: Not Currently   Drug use: Not on file   Sexual activity: Not on file  Other Topics Concern   Not on file  Social History Narrative   ** Merged History Encounter **       Social Determinants of Health   Financial Resource Strain: Not on file  Food Insecurity: No Food Insecurity (05/22/2022)   Hunger Vital Sign    Worried About Running Out of Food in the Last Year: Never true    Ran Out of Food in the Last Year: Never true  Transportation Needs: No Transportation Needs (05/22/2022)   PRAPARE - Hydrologist (Medical): No    Lack of Transportation (Non-Medical): No  Physical Activity: Not on file  Stress: Not on file  Social Connections: Not on file  HOSPITAL COURSE During the patient's hospitalization, patient had extensive initial psychiatric evaluation, and follow-up psychiatric evaluations every day. Psychiatric diagnoses provided upon initial assessment are as listed above. Patient's psychiatric medications were adjusted on admission as follows: -Started Risperdal 2 mg twice daily for psychosis/mood stabilization -Started trazodone 50 mg nightly as needed for sleep -Started hydroxyzine 25 mg 3 times daily as needed for anxiety   During the hospitalization, other adjustments were made to the patient's psychiatric medication regimen. Risperdal was discontinued after additional information was obtained from pt and his mother regarding Risperdal not being effective for over a year. Medications at discharge are as follows:   -Continue  Haldol 5 mg BID for psychosis/mood stabilization x 2 weeks then stop and continue with Haldol Decanoate LAI monthly  -Given Haldol Decanoate 50 mg IM on 05/26/2022-Next due on 06/24/2022. -Continue Cogentin 1 mg p.o. twice daily for EPS prophylaxis -Continue nicorette gum for nicotine addiction   Patient's care was discussed during the interdisciplinary team  meeting every day during the hospitalization. The patient denies having side effects to prescribed psychiatric medication. Gradually, patient started adjusting to milieu. The patient was evaluated each day by a clinical provider to ascertain response to treatment. Improvement was noted by the patient's report of decreasing symptoms, improved sleep and appetite, affect, medication tolerance, behavior, and participation in unit programming.  Patient was asked each day to complete a self inventory noting mood, mental status, pain, new symptoms, anxiety and concerns.     Symptoms were reported as significantly decreased or resolved completely by discharge. On day of discharge, the patient reports that their mood is stable. The patient denied having suicidal thoughts for more than 48 hours prior to discharge.  Patient denies having homicidal thoughts.  Patient denies having auditory hallucinations. Patient denies any visual hallucinations or other symptoms of psychosis. The patient was motivated to continue taking medication with a goal of continued improvement in mental health.    The patient reports their target psychiatric symptoms of psychosis & depression responded well to the psychiatric medications, and the patient reports overall benefit from this psychiatric hospitalization. Supportive psychotherapy was provided to the patient. The patient also participated in regular group therapy while hospitalized. Coping skills, problem solving as well as relaxation therapies were also part of the unit programming.   Labs were reviewed with the patient, and abnormal results were discussed with the patient.   The patient is able to verbalize their individual safety plan to this provider.   # It is recommended to the patient to continue psychiatric medications as prescribed, after discharge from the hospital.     # It is recommended to the patient to follow up with your outpatient psychiatric provider and PCP.    # It was discussed with the patient, the impact of alcohol, drugs, tobacco have been there overall psychiatric and medical wellbeing, and total abstinence from substance use was recommended the patient.ed.   # Prescriptions provided or sent directly to preferred pharmacy at discharge. Patient agreeable to plan. Given opportunity to ask questions. Appears to feel comfortable with discharge.    # In the event of worsening symptoms, the patient is instructed to call the crisis hotline (988), 911 and or go to the nearest ED for appropriate evaluation and treatment of symptoms. To follow-up with primary care provider for other medical issues, concerns and or health care needs   # Patient was discharged home to his mother's house with a plan to follow up as noted below.    Physical Findings: AIMS: 0 CIWA: na   COWS:  na   Musculoskeletal: Strength & Muscle Tone: within normal limits Gait & Station: normal Patient leans: N/A   Psychiatric Specialty Exam:  Presentation  General Appearance:  Appropriate for Environment; Fairly Groomed  Eye Contact: Good  Speech: Clear and Coherent  Speech Volume: Normal  Handedness: Right   Mood and Affect  Mood: Euthymic  Affect: Congruent   Thought Process  Thought Processes: Coherent  Descriptions of Associations:Intact  Orientation:Full (Time, Place and Person)  Thought Content:Logical  History of Schizophrenia/Schizoaffective disorder:Yes  Duration of Psychotic Symptoms:Greater than six months  Hallucinations:Hallucinations: None Description of  Auditory Hallucinations: Hears voices specially at night  Ideas of Reference:None  Suicidal Thoughts:Suicidal Thoughts: No  Homicidal Thoughts:Homicidal Thoughts: No   Sensorium  Memory: Immediate Good  Judgment: Fair  Insight: Fair   Community education officer  Concentration: Good  Attention Span: Good  Recall: Good  Fund of  Knowledge: Good  Language: Good   Psychomotor Activity  Psychomotor Activity: Psychomotor Activity: Normal   Assets  Assets: Communication Skills   Sleep  Sleep: Sleep: Good    Physical Exam: Physical Exam Constitutional:      Appearance: Normal appearance.  HENT:     Nose: No congestion or rhinorrhea.  Eyes:     Pupils: Pupils are equal, round, and reactive to light.  Pulmonary:     Effort: Pulmonary effort is normal.  Musculoskeletal:        General: Normal range of motion.  Neurological:     Mental Status: He is alert and oriented to person, place, and time.  Psychiatric:        Thought Content: Thought content normal.    Review of Systems  Constitutional: Negative.   HENT: Negative.    Eyes: Negative.   Cardiovascular: Negative.   Gastrointestinal: Negative.   Genitourinary: Negative.   Musculoskeletal: Negative.   Skin: Negative.   Neurological: Negative.   Psychiatric/Behavioral:  Positive for substance abuse (educated in [facility] use cessation). Negative for depression, hallucinations (resolving), memory loss and suicidal ideas. The patient is not nervous/anxious and does not have insomnia.    Blood pressure 111/68, pulse 77, temperature 98.2 F (36.8 C), temperature source Oral, resp. rate 16, height 5' 4"$  (1.626 m), weight 56.2 kg, SpO2 99 %. Body mass index is 21.28 kg/m.   Social History   Tobacco Use  Smoking Status Unknown  Smokeless Tobacco Not on file   Tobacco Cessation:  A prescription for an FDA-approved tobacco cessation medication provided at discharge   Blood Alcohol level:  Lab Results  Component Value Date   [facility] <10 07/28/2021   ETH <10 [id number]    Metabolic Disorder Labs:  Lab Results  Component Value Date   HGBA1C 4.9 05/22/2022   MPG 93.93 05/22/2022   MPG 93.93 07/28/2021   No results found for: "PROLACTIN" Lab Results  Component Value Date   CHOL 164 05/22/2022   TRIG 64 05/22/2022   HDL 37 (L) 05/22/2022    CHOLHDL 4.4 05/22/2022   VLDL 13 05/22/2022   LDLCALC 114 (H) 05/22/2022    See Psychiatric Specialty Exam and Suicide Risk Assessment completed by Attending Physician prior to discharge.  Discharge destination:  Home  Is patient on multiple antipsychotic therapies at discharge:  No   Has Patient had three or more failed trials of antipsychotic monotherapy by history:  No  Recommended Plan for Multiple Antipsychotic Therapies: NA  Discharge Instructions     Diet - low sodium heart healthy   Complete by: As directed    Increase activity slowly   Complete by: As directed       Allergies as of 05/27/2022   No Known Allergies      Medication List     STOP taking these medications    risperiDONE 2 MG tablet Commonly known as: RISPERDAL       TAKE these medications      Indication  benztropine 1 MG tablet Commonly known as: COGENTIN Take 1 tablet (1 mg total) by mouth 2 (two) times daily.  Indication: Extrapyramidal Reaction caused by Medications   haloperidol 5 MG  tablet Commonly known as: HALDOL Take 1 tablet (5 mg total) by mouth at bedtime.  Indication: Psychosis   haloperidol decanoate 100 MG/ML injection Commonly known as: HALDOL DECANOATE Inject 0.5 mLs (50 mg total) into the muscle every 30 (thirty) days. Start taking on: June 25, 2022  Indication: Schizophrenia   nicotine polacrilex 2 MG gum Commonly known as: NICORETTE Take 1 each (2 mg total) by mouth as needed for smoking cessation.  Indication: Nicotine Addiction        Follow-up Information     [facility], [place]. Go on 06/01/2022.   Why: You have a hospital follow up appointment for therapy and medication management services on 06/01/22 at 12:30 pm.  This appointment will be held in person. Contact information: [address] [place] [address] [phone]                 Signed: Nicholes Rough, NP 05/27/2022, 4:41 PM

## 2022-05-27 NOTE — BHH Group Notes (Signed)
Goals Group 05/27/22   Group Focus: affirmation, clarity of thought, and goals/reality orientation Treatment Modality:  Psychoeducation Interventions utilized were assignment, group exercise, and support Purpose: To be able to understand and verbalize the reason for their admission to the hospital. To understand that the medication helps with their chemical imbalance but they also need to work on their choices in life. To be challenged to develop a list of 30 positives about themselves. Also introduce the concept that "feelings" are not reality.  Participation Level:  did not attend  Jeremy Cooper

## 2022-05-27 NOTE — Progress Notes (Signed)
   05/27/22 0000  Psych Admission Type (Psych Patients Only)  Admission Status Involuntary  Psychosocial Assessment  Patient Complaints Anxiety  Eye Contact Fair  Facial Expression Animated  Affect Depressed  Speech Logical/coherent  Interaction Cautious  Motor Activity Slow  Appearance/Hygiene Unremarkable  Behavior Characteristics Guarded  Mood Depressed  Thought Process  Coherency Blocking  Content WDL  Delusions None reported or observed  Perception WDL  Hallucination None reported or observed  Judgment Poor  Confusion None  Danger to Self  Current suicidal ideation? Denies  Agreement Not to Harm Self Yes  Description of Agreement Verbal  Danger to Others  Danger to Others None reported or observed  Danger to Others Abnormal  Harmful Behavior to others No threats or harm toward other people  Destructive Behavior No threats or harm toward property

## 2022-05-27 NOTE — BHH Suicide Risk Assessment (Signed)
Suicide Risk Assessment  Discharge Assessment    [facility] Discharge Suicide Risk Assessment   Principal Problem: Undifferentiated schizophrenia [facility]) Discharge Diagnoses: Principal Problem:   Undifferentiated schizophrenia ([place]) Active Problems:   Insomnia   Delta-9-tetrahydrocannabinol (THC) dependence ([place])   Anxiety state  Reason for admission: Jeremy Cooper is a 22 yo [place] American male with a history of THC dependence who was taken to the [employer] [facility] health center [facility]) by his mother on 05/22/2022 for evaluation of auditory hallucinations in the context of medication non compliance. While at the [facility], pt also reported +SI & +HI, was guarded and would not disclose his plans.  Patient was deemed to be at risk of danger to self and others, and involuntarily committed prior to transfer by the [facility] Department to this behavioral [facility] for treatment and stabilization of his mental status.                                HOSPITAL COURSE During the patient's hospitalization, patient had extensive initial psychiatric evaluation, and follow-up psychiatric evaluations every day. Psychiatric diagnoses provided upon initial assessment are as listed above. Patient's psychiatric medications were adjusted on admission as follows: -Started Risperdal 2 mg twice daily for psychosis/mood stabilization -Started trazodone 50 mg nightly as needed for sleep -Started hydroxyzine 25 mg 3 times daily as needed for anxiety  During the hospitalization, other adjustments were made to the patient's psychiatric medication regimen. Risperdal was discontinued after additional information was obtained from pt and his mother regarding Risperdal not being effective for over a year. Medications at discharge are as follows:  -Continue Haldol 5 mg BID for psychosis/mood stabilization x 2 weeks then stop and continue with Haldol Decanoate LAI monthly  -Given Haldol Decanoate 50 mg IM on  05/26/2022-Next due on 06/24/2022. -Continue Cogentin 1 mg p.o. twice daily for EPS prophylaxis -Continue nicorette gum for nicotine addiction  Patient's care was discussed during the interdisciplinary team meeting every day during the hospitalization. The patient denies having side effects to prescribed psychiatric medication. Gradually, patient started adjusting to milieu. The patient was evaluated each day by a clinical provider to ascertain response to treatment. Improvement was noted by the patient's report of decreasing symptoms, improved sleep and appetite, affect, medication tolerance, behavior, and participation in unit programming.  Patient was asked each day to complete a self inventory noting mood, mental status, pain, new symptoms, anxiety and concerns.    Symptoms were reported as significantly decreased or resolved completely by discharge. On day of discharge, the patient reports that their mood is stable. The patient denied having suicidal thoughts for more than 48 hours prior to discharge.  Patient denies having homicidal thoughts.  Patient denies having auditory hallucinations. Patient denies any visual hallucinations or other symptoms of psychosis. The patient was motivated to continue taking medication with a goal of continued improvement in mental health.   The patient reports their target psychiatric symptoms of psychosis & depression responded well to the psychiatric medications, and the patient reports overall benefit from this psychiatric hospitalization. Supportive psychotherapy was provided to the patient. The patient also participated in regular group therapy while hospitalized. Coping skills, problem solving as well as relaxation therapies were also part of the unit programming.  Labs were reviewed with the patient, and abnormal results were discussed with the patient.  The patient is able to verbalize their individual safety plan to this provider.  # It  is recommended to  the patient to continue psychiatric medications as prescribed, after discharge from the hospital.    # It is recommended to the patient to follow up with your outpatient psychiatric provider and PCP.  # It was discussed with the patient, the impact of alcohol, drugs, tobacco have been there overall psychiatric and medical wellbeing, and total abstinence from substance use was recommended the patient.ed.  # Prescriptions provided or sent directly to preferred pharmacy at discharge. Patient agreeable to plan. Given opportunity to ask questions. Appears to feel comfortable with discharge.    # In the event of worsening symptoms, the patient is instructed to call the crisis hotline (988), 911 and or go to the nearest ED for appropriate evaluation and treatment of symptoms. To follow-up with primary care provider for other medical issues, concerns and or health care needs  # Patient was discharged home to his mother's house with a plan to follow up as noted below.   Total Time spent with patient: 30 minutes  Musculoskeletal: Strength & Muscle Tone: within normal limits Gait & Station: normal Patient leans: N/A  Psychiatric Specialty Exam  Presentation  General Appearance:  Appropriate for Environment; Fairly Groomed  Eye Contact: Good  Speech: Clear and Coherent  Speech Volume: Normal  Handedness: Right   Mood and Affect  Mood: Euthymic  Duration of Depression Symptoms: Greater than two weeks  Affect: Congruent   Thought Process  Thought Processes: Coherent  Descriptions of Associations:Intact  Orientation:Full (Time, Place and Person)  Thought Content:Logical  History of Schizophrenia/Schizoaffective disorder:Yes  Duration of Psychotic Symptoms:Greater than six months  Hallucinations:Hallucinations: None Description of Auditory Hallucinations: Hears voices specially at night  Ideas of Reference:None  Suicidal Thoughts:Suicidal Thoughts: No  Homicidal  Thoughts:Homicidal Thoughts: No   Sensorium  Memory: Immediate Good  Judgment: Fair  Insight: Fair   Community education officer  Concentration: Good  Attention Span: Good  Recall: Good  Fund of Knowledge: Good  Language: Good   Psychomotor Activity  Psychomotor Activity: Psychomotor Activity: Normal   Assets  Assets: Communication Skills   Sleep  Sleep: Sleep: Good   Physical Exam: Physical Exam Constitutional:      Appearance: Normal appearance.  HENT:     Head: Normocephalic.     Nose: Nose normal. No congestion.  Eyes:     Pupils: Pupils are equal, round, and reactive to light.  Pulmonary:     Effort: Pulmonary effort is normal.  Musculoskeletal:        General: Normal range of motion.     Cervical back: Normal range of motion.  Neurological:     Mental Status: He is alert and oriented to person, place, and time.  Psychiatric:        Thought Content: Thought content normal.    Review of Systems  Constitutional: Negative.  Negative for fever.  HENT: Negative.  Negative for hearing loss.   Eyes: Negative.  Negative for blurred vision.  Respiratory: Negative.    Cardiovascular: Negative.  Negative for chest pain.  Gastrointestinal: Negative.   Genitourinary: Negative.   Skin: Negative.   Endo/Heme/Allergies: Negative.   Psychiatric/Behavioral:  Positive for depression (Denies SI. Denies HI. Denies AVH, denies paranoia and verbally contracts for safety outside of this [facility]) and substance abuse (Educated on the negative impact of THC use on his mental health and verbalizes understanding. Educated on cessation). Negative for hallucinations and memory loss. The patient is not nervous/anxious and does not have insomnia.    Blood  pressure 111/68, pulse 77, temperature 98.2 F (36.8 C), temperature source Oral, resp. rate 16, height 5' 4"$  (1.626 m), weight 56.2 kg, SpO2 99 %. Body mass index is 21.28 kg/m.  Mental Status Per Nursing Assessment::    On Admission:  Suicidal ideation indicated by patient, Thoughts of violence towards others  Demographic Factors:  Male and Low socioeconomic status  Loss Factors: NA  Historical Factors: Family history of mental illness or substance abuse  Risk Reduction Factors:   Sense of responsibility to family, Employed, and Living with another person, especially a relative  Continued Clinical Symptoms:  Schizophrenia:   Paranoid or undifferentiated type-Auditory hallucinations have resolved. Pt currently denies SI, denies HI, denies AVH and verbally contracts for safety outside of this [facility].  Cognitive Features That Contribute To Risk:  None    Suicide Risk:  Mild:  There are no identifiable suicide plans, no associated intent, mild dysphoria and related symptoms, good self-control (both objective and subjective assessment), few other risk factors, and identifiable protective factors, including available and accessible social support.    Follow-up Information     [facility], [place]. Go on 06/01/2022.   Why: You have a hospital follow up appointment for therapy and medication management services on 06/01/22 at 12:30 pm.  This appointment will be held in person. Contact information: [place] [address] [place], NP 05/27/2022, 9:32 AM

## 2022-05-27 NOTE — Progress Notes (Addendum)
Patient is discharging at this time. Patient is A&Ox4. Vs stable. Patient denies SI,HI, and A/V/H with no plan/intent. Printed AVS reviewed with and given to patient along with medications and follow up appointments. Patient verbalized all understanding. All valuables/belongings returned to patient. Patient is being transported by his mother. Patient denies any pain/discomfort. No s/s of current distress.

## 2022-05-29 LAB — VITAMIN B1: Vitamin B1 (Thiamine): 88.7 nmol/L (ref 66.5–200.0)

## 2022-12-26 ENCOUNTER — Encounter: Payer: Self-pay | Admitting: Emergency Medicine

## 2022-12-26 ENCOUNTER — Other Ambulatory Visit: Payer: Self-pay

## 2022-12-26 ENCOUNTER — Ambulatory Visit
Admission: EM | Admit: 2022-12-26 | Discharge: 2022-12-26 | Disposition: A | Discharge status: Home / Self Care | Payer: Self-pay | Attending: Family Medicine | Admitting: Family Medicine

## 2022-12-26 DIAGNOSIS — T23101A Burn of first degree of right hand, unspecified site, initial encounter: Secondary | ICD-10-CM

## 2022-12-26 DIAGNOSIS — T23172A Burn of first degree of left wrist, initial encounter: Secondary | ICD-10-CM

## 2022-12-26 MED ORDER — SILVER SULFADIAZINE 1 % EX CREA: TOPICAL_CREAM | CUTANEOUS | 0 refills | Status: AC
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
## 2022-12-26 NOTE — ED Provider Notes (Signed)
[facility]    CSN: [id number] Arrival date & time: 12/26/22  1505      History   Chief Complaint Chief Complaint  Patient presents with   Burn    HPI Jeremy Cooper is a 22 y.o. male.   HPI Pleasant 22 year old male presents with burn of right hand and left wrist. Patient reports sustained yesterday from hot water while washing dishes at work.  PMH significant for undifferentiated schizophrenia, insomnia and anxiety.  History reviewed. No pertinent past medical history.  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 No pertinent surgical history.     Home Medications    Prior to Admission medications   Medication Sig Start Date End Date Taking? Authorizing Provider  benztropine (COGENTIN) 1 MG tablet Take 1 tablet (1 mg total) by mouth 2 (two) times daily. Patient not taking: Reported on 12/26/2022 05/27/22   Rex Kras, MD  haloperidol (HALDOL) 5 MG tablet Take 1 tablet (5 mg total) by mouth at bedtime. Patient not taking: Reported on 12/26/2022 05/27/22   Rex Kras, MD  haloperidol decanoate (HALDOL DECANOATE) 100 MG/ML injection Inject 0.5 mLs (50 mg total) into the muscle every 30 (thirty) days. Patient not taking: Reported on 12/26/2022 06/25/22   Rex Kras, MD  nicotine polacrilex (NICORETTE) 2 MG gum Take 1 each (2 mg total) by mouth as needed for smoking cessation. 05/27/22   Rex Kras, MD  silver sulfADIAZINE (SILVADENE) 1 % cream Apply once daily to affected areas of the right hand and left wrist once daily for the next 3 to 4 days. 12/26/22  Yes Trevor Iha, FNP    Family History History reviewed. No pertinent family history.  Social History Social History   Tobacco Use   Smoking status: Unknown  Vaping Use   Vaping status:  Every Day  Substance Use Topics   Alcohol use: Not Currently     Allergies   Patient has no known allergies.   Review of Systems Review of Systems  Skin:  Positive for wound.  All other systems reviewed and are negative.    Physical Exam Triage Vital Signs ED Triage Vitals  Encounter Vitals Group     BP 12/26/22 1527 105/75     Systolic BP Percentile --      Diastolic BP Percentile --      Pulse Rate 12/26/22 1527 85     Resp 12/26/22 1527 16     Temp 12/26/22 1527 98.3 F (36.8 C)     Temp Source 12/26/22 1527 Oral     SpO2 12/26/22 1527 97 %     Weight --      Height --      Head Circumference --      Peak Flow --      Pain Score 12/26/22 1524 7     Pain Loc --      Pain Education --      Exclude from Growth Chart --    No data found.  Updated Vital Signs BP 105/75 (BP Location: Left Arm)   Pulse 85   Temp 98.3 F (36.8 C) (Oral)   Resp 16   SpO2 97%     Physical Exam Vitals and nursing note reviewed.  Constitutional:      Appearance: Normal appearance. He is normal weight.  HENT:  Head: Normocephalic and atraumatic.     Mouth/Throat:     Mouth: Mucous membranes are moist.     Pharynx: Oropharynx is clear.  Eyes:     Extraocular Movements: Extraocular movements intact.     Conjunctiva/sclera: Conjunctivae normal.     Pupils: Pupils are equal, round, and reactive to light.  Cardiovascular:     Rate and Rhythm: Normal rate and regular rhythm.     Pulses: Normal pulses.     Heart sounds: Normal heart sounds.  Pulmonary:     Effort: Pulmonary effort is normal.     Breath sounds: Normal breath sounds. No wheezing, rhonchi or rales.  Musculoskeletal:        General: Normal range of motion.     Cervical back: Normal range of motion and neck supple.  Skin:    General: Skin is warm and dry.     Comments: Right hand/right wrist (dorsum): Superficial partial-thickness burns noted with new eschar formation (burn sustained in hot water per patient  yesterday while dishwashing); left wrist (medial dorsal aspect): Superficial partial-thickness burn noted, with new eschar formation-please see images below  Neurological:     General: No focal deficit present.     Mental Status: He is alert and oriented to person, place, and time. Mental status is at baseline.  Psychiatric:        Mood and Affect: Mood normal.        Behavior: Behavior normal.        Thought Content: Thought content normal.         UC Treatments / Results  Labs (all labs ordered are listed, but only abnormal results are displayed) Labs Reviewed - No data to display  EKG   Radiology No results found.  Procedures Procedures (including critical care time)  Medications Ordered in UC Medications - No data to display  Initial Impression / Assessment and Plan / UC Course  I have reviewed the triage vital signs and the nursing notes.  Pertinent labs & imaging results that were available during my care of the patient were reviewed by me and considered in my medical decision making (see chart for details).     MDM: 1.  Superficial partial-thickness burn of right hand, unspecified site of hand, initial encounter-silver sulfadiazine cream 1% placed liberally on burn area with large glove placed over prior to discharge; 2.  Superficial burn of left wrist, initial encounter-silver sulfadiazine cream 1% covered with Band-Aids prior to discharge.  Encourage patient not to submerge hands completely in water until scab formation,6-7 days possibly.  Patient discharged home, hemodynamically stable. Advised patient to place silver sulfadiazine on affected burn areas of right hand and left wrist daily for the next 3 days.  Encouraged increase in daily water intake. Advised afterwards leave open to air and allow skin to heal by secondary intention/scab formation.  Advised if symptoms worsen and/or unresolved please follow-up with PCP or here for further evaluation.  Patient  discharged home, hemodynamically stable.  Work note provided to patient prior to discharge.   Final Clinical Impressions(s) / UC Diagnoses   Final diagnoses:  Superficial burn of right hand, unspecified site of hand, initial encounter  Superficial burn of left wrist, initial encounter     Discharge Instructions      Advised patient to place silver sulfadiazine on affected burn areas of right hand and left wrist daily for the next 3 days.  Encouraged increase in daily water intake. Advised afterwards leave open to air  and allow skin to heal by secondary intention/scab formation.  Advised if symptoms worsen and/or unresolved please follow-up with PCP or here for further evaluation.     ED Prescriptions     Medication Sig Dispense Auth. Provider   silver sulfADIAZINE (SILVADENE) 1 % cream Apply once daily to affected areas of the right hand and left wrist once daily for the next 3 to 4 days. 50 g Trevor Iha, FNP      PDMP not reviewed this encounter.   Trevor Iha, FNP 12/26/22 1606

## 2022-12-26 NOTE — ED Triage Notes (Addendum)
Sustained a burn to hand yesterday from hot water. Predominantly blisters to back of right hand and radial aspect of left wrist.  No blisters or redness to any fingers.   Patient is right handed Has not taken any pills or used any creams/lotions

## 2022-12-28 ENCOUNTER — Ambulatory Visit (HOSPITAL_COMMUNITY)
Admission: EM | Admit: 2022-12-28 | Discharge: 2022-12-28 | Disposition: A | Discharge status: Home / Self Care | Payer: No Payment, Other | Attending: Psychiatry | Admitting: Psychiatry

## 2022-12-28 DIAGNOSIS — Z91128 Patient's intentional underdosing of medication regimen for other reason: Secondary | ICD-10-CM | POA: Insufficient documentation

## 2022-12-28 DIAGNOSIS — F251 Schizoaffective disorder, depressive type: Secondary | ICD-10-CM | POA: Insufficient documentation

## 2022-12-28 DIAGNOSIS — R440 Auditory hallucinations: Secondary | ICD-10-CM

## 2022-12-28 DIAGNOSIS — Z91148 Patient's other noncompliance with medication regimen for other reason: Secondary | ICD-10-CM

## 2022-12-28 DIAGNOSIS — T434X6A Underdosing of butyrophenone and thiothixene neuroleptics, initial encounter: Secondary | ICD-10-CM | POA: Insufficient documentation

## 2022-12-28 DIAGNOSIS — F32A Depression, unspecified: Secondary | ICD-10-CM

## 2022-12-28 NOTE — Discharge Instructions (Signed)
F/u with outpatient provider tomorrow or walk-in psychiatry

## 2022-12-28 NOTE — Progress Notes (Signed)
   12/28/22 2148  [facility] Triage Screening (Walk-ins at [facility] only)  How Did You Hear About [place]? Family/Friend  What Is the Reason for Your Visit/Call Today? Patient presents to [facility] accompanied by his mother. Patient reports being depressed. Pateint reports experiencing depression for motre than 6 months. Patient was unable to articulate depression symptoms. Patient denies SI/HI and AVH at this time. Patient was not cooperative during triage and appeared visbly irrtable. Patient denies ay alcohol adn drug use within the past 24 hours. Patient reports not knowing if being here will help or what would be most helpful this evening. Patient is routine.  How Long Has This Been Causing You Problems? > than 6 months  Have You Recently Had Any Thoughts About Hurting Yourself? No  Are You Planning to Commit Suicide/Harm Yourself At This time? No  Have you Recently Had Thoughts About Hurting Someone Karolee Ohs? No  Are You Planning To Harm Someone At This Time? No  Are you currently experiencing any auditory, visual or other hallucinations? No  Do you have any current medical co-morbidities that require immediate attention? No  Clinician description of patient physical appearance/behavior: Patient was not cooperative during triage. Patient was fairly groomed and appeared irritable.  What Do You Feel Would Help You the Most Today? Stress Management;Treatment for Depression or other mood problem  If access to [facility] was not available, would you have sought care in the Emergency Department? No  Determination of Need Routine (7 days)  Options For Referral Therapeutic Triage Services

## 2022-12-28 NOTE — ED Notes (Signed)
Patient supplied with AVS. Discharge instructions and follow up instructions supplied and explained to patient. Patient verbalized understanding and denied further questions. Patient was escorted out of building without incident. 

## 2022-12-28 NOTE — ED Triage Notes (Signed)
Patient presents to [facility] accompanied by his mother. Patient reports being depressed. Pateint reports experiencing depression for motre than 6 months. Patient was unable to articulate depression symptoms. Patient denies SI/HI and AVH at this time. Patient was not cooperative during triage and appeared visbly irrtable. Patient denies ay alcohol adn drug use within the past 24 hours. Patient reports not knowing if being here will help or what would be most helpful this evening. Patient is routine.

## 2022-12-28 NOTE — ED Provider Notes (Signed)
Behavioral Health Urgent Care Medical Screening Exam  Patient Name: Jeremy Cooper MRN: [id number] Date of Evaluation: 12/29/22 Chief Complaint:   Diagnosis:  Final diagnoses:  Depression, unspecified depression type  Schizoaffective disorder, depressive type (HCC)  Auditory hallucination  H/O medication noncompliance    History of Present illness: Jeremy Cooper is a 22 y.o. male. With a history of schizoaffective disorder depression type presented to [facility] voluntarily accompanied by his mother.  Patient is not very talkative and so mother does most of the talking for the patient per the mother patient has been having depression on and off for years patient was seeing a psychiatrist in the [facility] area and he was seeing a therapist at [facility] in [facility].  Patient abruptly stopped taking his medicine in June of this year patient was taking Haldol injectable and other medications however according to the patient these medications were not working.  Per the mother patient had moved away but now he is back  it in the area.   According to the patient he does have periodically auditory hallucination when asked what was the hallucinations saying patient stated was saying inappropriate things but he would not elaborate on what it was.  Patient only gives 1 word answers each time patient does appear to be very angry and when writer discussed with patient about compliance with medication patient stated did not work and he not taking them.  Face-to-face observation of patient, patient is alert and oriented x 4, speech is clear.  Patient very soft spoken does not talk a lot and only at times give 1 word answers.  Patient denies SI, HI, or paranoia.  Patient report auditory hallucination but could not tell me exactly what the voices were saying.  Patient denies alcohol use denies any illicit drug at this time.  Patient reported he gets 8+ hours sleep each night denies any aggression see his behavior.   According to the patient he is currently not taking none of the medicine because they do not work. Writer did discuss with patient and mother the importance of medication compliance and the need to reach out to the patient's provider to inform him that the medicine he is currently on is not working so they can brainstorm other medication options.  Mother did reassure Clinical research associate that she will definitely reach out to the provider tomorrow and also Clinical research associate did give mom and patient resources to walk-in psychiatry should they need to utilize it.  At this time patient does not seem to be influenced by external stimuli does not pose a risk to himself or others.  The were instructed to call 911 or go to the nearest ED should he experience any hallucination, or any deterioration in his mental health.  Recommend discharge for patient to follow-up with outpatient provider.   Flowsheet Row ED from 12/28/2022 in [facility] ED from 12/26/2022 in [facility] Admission (Discharged) [facility]  C-SSRS RISK CATEGORY No Risk No Risk Moderate Risk       Psychiatric Specialty Exam  Presentation  General Appearance:Casual  Eye Contact:Absent  Speech:Clear and Coherent  Speech Volume:Normal  Handedness:Right   Mood and Affect  Mood: Depressed  Affect: Constricted   Thought Process  Thought Processes: Coherent  Descriptions of Associations:Intact  Orientation:Full (Time, Place and Person)  Thought Content:WDL  Diagnosis of Schizophrenia or Schizoaffective disorder in past: Yes  Duration of Psychotic Symptoms: Greater than six  months  Hallucinations:None Hears voices specially at night  Ideas of Reference:None  Suicidal Thoughts:No  Homicidal Thoughts:No   Sensorium  Memory: Immediate Good  Judgment: Fair  Insight: Fair   Art therapist   Concentration: Good  Attention Span: Good  Recall: Good  Fund of Knowledge: Good  Language: Good   Psychomotor Activity  Psychomotor Activity: Normal   Assets  Assets: Desire for Improvement   Sleep  Sleep: Good  Number of hours:  8   Physical Exam: Physical Exam HENT:     Head: Normocephalic.     Nose: Nose normal.  Eyes:     Pupils: Pupils are equal, round, and reactive to light.  Cardiovascular:     Rate and Rhythm: Normal rate.  Pulmonary:     Effort: Pulmonary effort is normal.  Musculoskeletal:        General: Normal range of motion.     Cervical back: Normal range of motion.  Neurological:     General: No focal deficit present.     Mental Status: He is alert.  Psychiatric:        Mood and Affect: Mood normal.        Thought Content: Thought content normal.        Judgment: Judgment normal.    Review of Systems  Constitutional: Negative.   HENT: Negative.    Eyes: Negative.   Respiratory: Negative.    Cardiovascular: Negative.   Gastrointestinal: Negative.   Genitourinary: Negative.   Musculoskeletal: Negative.   Skin: Negative.   Neurological: Negative.   Psychiatric/Behavioral:  Positive for depression and hallucinations.    Blood pressure 109/79, pulse 61, temperature 98.1 F (36.7 C), temperature source Oral, resp. rate 18, SpO2 100%. There is no height or weight on file to calculate BMI.  Musculoskeletal: Strength & Muscle Tone: within normal limits Gait & Station: normal Patient leans: N/A   [facility] MSE Discharge Disposition for Follow up and Recommendations: Based on my evaluation the patient does not appear to have an emergency medical condition and can be discharged with resources and follow up care in outpatient services for Medication Management and Group Therapy   Sindy Guadeloupe, NP 12/29/2022, 5:03 AM
